# Patient Record
Sex: Female | Born: 1975 | Race: Black or African American | Hispanic: No | Marital: Single | Smoking: Never smoker
Health system: Southern US, Community
[De-identification: ages and names within clinical notes are randomized; demographics above are authoritative.]

## PROBLEM LIST (undated history)

## (undated) DIAGNOSIS — K429 Umbilical hernia without obstruction or gangrene: Secondary | ICD-10-CM

## (undated) DIAGNOSIS — Z9109 Other allergy status, other than to drugs and biological substances: Secondary | ICD-10-CM

---

## 2002-09-20 ENCOUNTER — Emergency Department (HOSPITAL_COMMUNITY): Admission: EM | Admit: 2002-09-20 | Discharge: 2002-09-20 | Payer: Self-pay | Admitting: Emergency Medicine

## 2002-09-20 ENCOUNTER — Encounter: Payer: Self-pay | Admitting: Emergency Medicine

## 2005-04-18 ENCOUNTER — Ambulatory Visit: Payer: Self-pay | Admitting: Obstetrics & Gynecology

## 2009-09-14 ENCOUNTER — Inpatient Hospital Stay (HOSPITAL_COMMUNITY): Admission: AD | Admit: 2009-09-14 | Discharge: 2009-09-14 | Payer: Self-pay | Admitting: Obstetrics & Gynecology

## 2009-11-01 ENCOUNTER — Ambulatory Visit: Payer: Self-pay | Admitting: Oncology

## 2010-01-17 ENCOUNTER — Encounter (INDEPENDENT_AMBULATORY_CARE_PROVIDER_SITE_OTHER): Payer: Self-pay | Admitting: Obstetrics & Gynecology

## 2010-01-17 ENCOUNTER — Ambulatory Visit (HOSPITAL_COMMUNITY): Admission: RE | Admit: 2010-01-17 | Discharge: 2010-01-17 | Payer: Self-pay | Admitting: Obstetrics & Gynecology

## 2010-01-17 ENCOUNTER — Ambulatory Visit: Payer: Self-pay | Admitting: Vascular Surgery

## 2010-04-28 ENCOUNTER — Inpatient Hospital Stay (HOSPITAL_COMMUNITY): Admission: AD | Admit: 2010-04-28 | Discharge: 2010-05-02 | Payer: Self-pay | Admitting: Obstetrics & Gynecology

## 2010-04-29 ENCOUNTER — Encounter (INDEPENDENT_AMBULATORY_CARE_PROVIDER_SITE_OTHER): Payer: Self-pay | Admitting: Obstetrics and Gynecology

## 2010-05-02 ENCOUNTER — Encounter: Admission: RE | Admit: 2010-05-02 | Discharge: 2010-06-01 | Payer: Self-pay | Admitting: Obstetrics & Gynecology

## 2010-06-02 ENCOUNTER — Encounter
Admission: RE | Admit: 2010-06-02 | Discharge: 2010-07-02 | Payer: Self-pay | Source: Home / Self Care | Admitting: Obstetrics & Gynecology

## 2010-07-03 ENCOUNTER — Encounter
Admission: RE | Admit: 2010-07-03 | Discharge: 2010-08-02 | Payer: Self-pay | Source: Home / Self Care | Attending: Obstetrics & Gynecology | Admitting: Obstetrics & Gynecology

## 2010-08-03 ENCOUNTER — Encounter
Admission: RE | Admit: 2010-08-03 | Discharge: 2010-08-14 | Payer: Self-pay | Source: Home / Self Care | Attending: Obstetrics & Gynecology | Admitting: Obstetrics & Gynecology

## 2010-10-11 LAB — CBC
HCT: 31.8 % — ABNORMAL LOW (ref 36.0–46.0)
HCT: 35.5 % — ABNORMAL LOW (ref 36.0–46.0)
Hemoglobin: 10.9 g/dL — ABNORMAL LOW (ref 12.0–15.0)
Hemoglobin: 12.2 g/dL (ref 12.0–15.0)
MCH: 34 pg (ref 26.0–34.0)
MCH: 34.1 pg — ABNORMAL HIGH (ref 26.0–34.0)
MCHC: 34.4 g/dL (ref 30.0–36.0)
MCHC: 34.5 g/dL (ref 30.0–36.0)
MCV: 98.5 fL (ref 78.0–100.0)
MCV: 99 fL (ref 78.0–100.0)
Platelets: 165 10*3/uL (ref 150–400)
Platelets: 185 10*3/uL (ref 150–400)
RBC: 3.22 MIL/uL — ABNORMAL LOW (ref 3.87–5.11)
RBC: 3.59 MIL/uL — ABNORMAL LOW (ref 3.87–5.11)
RDW: 12.6 % (ref 11.5–15.5)
RDW: 13.1 % (ref 11.5–15.5)
WBC: 5.8 10*3/uL (ref 4.0–10.5)
WBC: 6.5 10*3/uL (ref 4.0–10.5)

## 2010-10-11 LAB — RPR: RPR Ser Ql: NONREACTIVE

## 2010-10-11 LAB — DIC (DISSEMINATED INTRAVASCULAR COAGULATION)PANEL
D-Dimer, Quant: 1.3 ug/mL-FEU — ABNORMAL HIGH (ref 0.00–0.48)
Fibrinogen: 529 mg/dL — ABNORMAL HIGH (ref 204–475)
INR: 0.99 (ref 0.00–1.49)
Platelets: 162 10*3/uL (ref 150–400)
Prothrombin Time: 13.3 seconds (ref 11.6–15.2)
Smear Review: NONE SEEN
aPTT: 28 seconds (ref 24–37)

## 2010-10-18 LAB — URINALYSIS, ROUTINE W REFLEX MICROSCOPIC
Ketones, ur: NEGATIVE mg/dL
Nitrite: NEGATIVE
Protein, ur: NEGATIVE mg/dL
Urobilinogen, UA: 0.2 mg/dL (ref 0.0–1.0)
pH: 6 (ref 5.0–8.0)

## 2010-10-18 LAB — HCG, QUANTITATIVE, PREGNANCY: hCG, Beta Chain, Quant, S: 114878 m[IU]/mL — ABNORMAL HIGH (ref <5)

## 2010-10-18 LAB — CBC
MCHC: 33.8 g/dL (ref 30.0–36.0)
MCV: 93.9 fL (ref 78.0–100.0)
Platelets: 205 10*3/uL (ref 150–400)
RDW: 12.1 % (ref 11.5–15.5)
WBC: 3.9 10*3/uL — ABNORMAL LOW (ref 4.0–10.5)

## 2011-06-15 ENCOUNTER — Other Ambulatory Visit: Payer: Self-pay

## 2011-06-15 ENCOUNTER — Emergency Department (HOSPITAL_COMMUNITY)
Admission: EM | Admit: 2011-06-15 | Discharge: 2011-06-16 | Disposition: A | Discharge status: Home / Self Care | Payer: 59 | Attending: Emergency Medicine | Admitting: Emergency Medicine

## 2011-06-15 DIAGNOSIS — R0602 Shortness of breath: Secondary | ICD-10-CM | POA: Insufficient documentation

## 2011-06-15 DIAGNOSIS — M940 Chondrocostal junction syndrome [Tietze]: Secondary | ICD-10-CM | POA: Insufficient documentation

## 2011-06-15 DIAGNOSIS — J40 Bronchitis, not specified as acute or chronic: Secondary | ICD-10-CM | POA: Insufficient documentation

## 2011-06-15 DIAGNOSIS — R079 Chest pain, unspecified: Secondary | ICD-10-CM | POA: Insufficient documentation

## 2011-06-15 DIAGNOSIS — R509 Fever, unspecified: Secondary | ICD-10-CM | POA: Insufficient documentation

## 2011-06-16 ENCOUNTER — Encounter: Payer: Self-pay | Admitting: *Deleted

## 2011-06-16 ENCOUNTER — Emergency Department (HOSPITAL_COMMUNITY): Payer: 59

## 2011-06-16 LAB — BASIC METABOLIC PANEL
BUN: 11 mg/dL (ref 6–23)
CO2: 26 mEq/L (ref 19–32)
Chloride: 104 mEq/L (ref 96–112)
Creatinine, Ser: 0.77 mg/dL (ref 0.50–1.10)
Potassium: 4.2 mEq/L (ref 3.5–5.1)

## 2011-06-16 LAB — CBC
HCT: 37.2 % (ref 36.0–46.0)
Hemoglobin: 12.8 g/dL (ref 12.0–15.0)
MCV: 92.8 fL (ref 78.0–100.0)
RBC: 4.01 MIL/uL (ref 3.87–5.11)
WBC: 4.4 10*3/uL (ref 4.0–10.5)

## 2011-06-16 MED ORDER — HYDROCOD POLST-CHLORPHEN POLST 10-8 MG/5ML PO LQCR
5.0000 mL | Freq: Once | ORAL | Status: AC
  Administered 2011-06-16: 5 mL via ORAL
  Filled 2011-06-16: qty 5

## 2011-06-16 MED ORDER — ALBUTEROL SULFATE HFA 108 (90 BASE) MCG/ACT IN AERS
2.0000 | INHALATION_SPRAY | RESPIRATORY_TRACT | Status: DC | PRN
  Administered 2011-06-16: 2 via RESPIRATORY_TRACT
  Filled 2011-06-16 (×2): qty 6.7

## 2011-06-16 MED ORDER — HYDROCOD POLST-CHLORPHEN POLST 10-8 MG/5ML PO LQCR: 5.0000 mL | Freq: Two times a day (BID) | ORAL | Status: DC | PRN

## 2011-06-16 MED ORDER — IBUPROFEN 400 MG PO TABS: 400.0000 mg | ORAL_TABLET | Freq: Three times a day (TID) | ORAL | Status: AC

## 2011-06-16 MED ORDER — IOHEXOL 300 MG/ML  SOLN
100.0000 mL | Freq: Once | INTRAMUSCULAR | Status: AC | PRN
  Administered 2011-06-16: 100 mL via INTRAVENOUS

## 2011-06-16 NOTE — ED Notes (Signed)
Pt given pillow for comfort, and repositioned in bed.

## 2011-06-16 NOTE — ED Notes (Signed)
Pt c/o cough x 2 weeks, shortness of breath and chest pain w/ cough starting today. Pt states worse w/ cough and bending over.

## 2011-06-16 NOTE — ED Provider Notes (Signed)
Medical screening examination/treatment/procedure(s) were performed by non-physician practitioner and as supervising physician I was immediately available for consultation/collaboration.  Jasmine Awe, MD 06/16/11 [phone]

## 2011-06-16 NOTE — ED Notes (Signed)
Patient is alert and oriented x3.  She was given DC instructions with MD referrals.  V/S stable She is DC to home with husband.  She is not showing any signs of distress at this time

## 2011-06-16 NOTE — ED Provider Notes (Signed)
History     CSN: [id number] Arrival date & time: 06/15/2011 11:23 PM   First MD Initiated Contact with Patient 06/16/11 0031      Chief Complaint  Patient presents with  . Pleurisy  . Shortness of Breath  . Cough    HPI: Patient is a 35 y.o. female presenting with shortness of breath and cough. The history is provided by the patient. No language interpreter was used.  Shortness of Breath  The current episode started yesterday. The onset was sudden. The problem has been unchanged. The problem is moderate. The symptoms are relieved by nothing. The symptoms are aggravated by nothing. Associated symptoms include chest pain, a fever, cough and shortness of breath. Her past medical history is significant for eczema. Her past medical history does not include asthma, bronchiolitis, past wheezing or asthma in the family. She has been behaving normally.  Cough Associated symptoms include chest pain, chills and shortness of breath. Her past medical history does not include asthma.  6 patient reports persistent dry hacking cough for approximately 2 weeks reports today approximately 12 noon had onset of left-sided chest pain it was associated with increased shortness of breath. Pain has been constant with varying intensity since onset. Describes the pain as a sharp pulling sensation and worsens with deep breathing coughing and certain movements. Admits the chest wall is tender to palpation particularly on the left side States that she did have a low-grade fever and chills approximately one week ago. But no fever since. Denies nausea vomiting diarrhea or other associated symptoms. History reviewed. No pertinent past medical history.  History reviewed. No pertinent past surgical history.  History reviewed. No pertinent family history.  History  Substance Use Topics  . Smoking status: Never Smoker   . Smokeless tobacco: Not on file  . Alcohol Use: Yes    OB History    Grav Para Term Preterm  Abortions TAB SAB Ect Mult Living                  Review of Systems  Constitutional: Positive for fever and chills.  HENT: Negative.   Eyes: Negative.   Respiratory: Positive for cough and shortness of breath.   Cardiovascular: Positive for chest pain.  Gastrointestinal: Negative.   Genitourinary: Negative.   Musculoskeletal: Negative.   Skin: Negative.   Neurological: Negative.   Hematological: Negative.   Psychiatric/Behavioral: Negative.     Allergies  Review of patient's allergies indicates no known allergies.  Home Medications  No current outpatient prescriptions on file.  BP 107/88  Pulse 77  Temp(Src) 98.5 F (36.9 C) (Oral)  Resp 26  Wt 120 lb (54.432 kg)  SpO2 100%  LMP 06/09/2011  Physical Exam  Constitutional: She is oriented to person, place, and time. She appears well-developed and well-nourished.  HENT:  Head: Normocephalic and atraumatic.  Eyes: Conjunctivae are normal.  Neck: Normal range of motion.  Cardiovascular: Normal rate and regular rhythm.   Pulmonary/Chest: Effort normal. She exhibits tenderness.    Abdominal: Soft. Bowel sounds are normal.  Musculoskeletal: Normal range of motion.  Neurological: She is alert and oriented to person, place, and time.  Skin: Skin is warm and dry.  Psychiatric: She has a normal mood and affect.    ED Course  Procedures Discussed d-dimer results with patient, as well as remaining negative lab results. Discussed need for CT chest engineer to rule out pulmonary embolus. Patient agreeable with plan.  Discuss results of CT chest angiogram patient and  plan to treat for bronchitis and discharged home with inhaler, medication for pain, and encourage close followup with PCP.    Labs Reviewed  CBC  BASIC METABOLIC PANEL  POCT CARDIAC MARKERS  D-DIMER, QUANTITATIVE  POCT PREGNANCY, URINE   No results found.   No diagnosis found.    MDM  PE, lab results, and CT chest angio without acute findings.  Suggests bronchitis as source of patient's cough, and likely costochondritis due to frequent coughing, as source of chest wall pain.      Leanne Chang, NP 06/16/11 [phone]

## 2012-12-29 ENCOUNTER — Encounter (HOSPITAL_COMMUNITY): Payer: Self-pay | Admitting: Emergency Medicine

## 2012-12-29 ENCOUNTER — Emergency Department (HOSPITAL_COMMUNITY)
Admission: EM | Admit: 2012-12-29 | Discharge: 2012-12-29 | Disposition: A | Discharge status: Home / Self Care | Payer: 59 | Source: Home / Self Care | Attending: Emergency Medicine | Admitting: Emergency Medicine

## 2012-12-29 DIAGNOSIS — M436 Torticollis: Secondary | ICD-10-CM

## 2012-12-29 MED ORDER — CYCLOBENZAPRINE HCL 5 MG PO TABS: 5.0000 mg | ORAL_TABLET | Freq: Three times a day (TID) | ORAL | Status: DC | PRN

## 2012-12-29 MED ORDER — OXYCODONE-ACETAMINOPHEN 5-325 MG PO TABS: ORAL_TABLET | ORAL | Status: DC

## 2012-12-29 MED ORDER — IBUPROFEN 800 MG PO TABS
ORAL_TABLET | ORAL | Status: AC
  Filled 2012-12-29: qty 1

## 2012-12-29 MED ORDER — NAPROXEN 500 MG PO TABS: 500.0000 mg | ORAL_TABLET | Freq: Two times a day (BID) | ORAL | Status: DC

## 2012-12-29 MED ORDER — IBUPROFEN 800 MG PO TABS
800.0000 mg | ORAL_TABLET | Freq: Once | ORAL | Status: AC
  Administered 2012-12-29: 800 mg via ORAL

## 2012-12-29 NOTE — ED Provider Notes (Signed)
Chief Complaint:   Chief Complaint  Patient presents with  . Torticollis    History of Present Illness:   Janet Duarte is a 37 year old female who woke up 3 days ago with pain in the left side of the neck. This was localized on the lateral side of the neck, radiating to the trapezius ridge but not down the arm. He she had pain with movement of the neck especially to the left and also with swallowing. She denies any numbness, tingling, or weakness in lower extremities. She's had no fever, chills, headache, or URI symptoms. She denies any chest pain or shortness of breath. She's had no trauma to the neck and denies any prior problems with her neck. He  Review of Systems:  Other than noted above, the patient denies any of the following symptoms: Constitutional:  No fever, chills, or sweats. ENT:  No nasal congestion, sore throat, or oral ulcerations or lesions. Neck:  No swelling, or adenopathy.  Full ROM without pain. Cardiac:  No chest pain, tightness, or pressure. Respiratory:  No cough, wheezing, or dyspnea. M-S:  No joint pain, muscle pain, or back problems. Neuro:  No muscle weakness, numbness or paresthesias.  PMFSH:  Past medical history, family history, social history, meds, and allergies were reviewed.   Physical Exam:   Vital signs:  Pulse 72  Temp(Src) 98.6 F (37 C) (Oral)  Resp 18  SpO2 100%  LMP 12/20/2012 General:  Alert, oriented and in no distress. Eye:  PERRL, full EOMs. ENT:  Pharynx clear, no oral lesions. Neck:  There is moderate tenderness to palpation on the left side of the neck but no midline tenderness. The neck has limited range of motion. She has about 45 of rotation to the right and about 30 of rotation to the left. She has 20 of extension, 45 of flexion, 20 of lateral bending to each side with pain. Lungs:  No respiratory distress.  Breath sounds clear and equal bilaterally.  No wheezes, rales or rhonchi. Heart:  Regular rhythm.  No gallops, murmers,  or rubs. Ext:  No upper extremity edema, pulses full.  Full ROM of joints with no joint or muscle pain to palpation. Neuro:  Alert and oriented times 3.  No focal muscle weakness.  DTRs symmetric.  Sensation intact to light touch. Skin: Clear, warm and dry.  No rash.  Good capillary refill.  Course in Urgent Care Center:   Given ibuprofen 800 mg by mouth for pain.  Assessment:  The encounter diagnosis was Torticollis.  Plan:   1.  The following meds were prescribed:   Discharge Medication List as of 12/29/2012 12:43 PM    START taking these medications   Details  cyclobenzaprine (FLEXERIL) 5 MG tablet Take 1 tablet (5 mg total) by mouth 3 (three) times daily as needed for muscle spasms., Starting 12/29/2012, Until Discontinued, Normal    naproxen (NAPROSYN) 500 MG tablet Take 1 tablet (500 mg total) by mouth 2 (two) times daily., Starting 12/29/2012, Until Discontinued, Normal    oxyCODONE-acetaminophen (PERCOCET) 5-325 MG per tablet 1 to 2 tablets every 6 hours as needed for pain., Print       2.  The patient was instructed in symptomatic care and handouts were given. She was given some neck exercises to do followed by moist heat. 3.  The patient was told to return if becoming worse in any way, if no better in 7 days, and given some red flag symptoms such as fever or  changing neurological symptoms that would indicate earlier return. 4.  Follow up here if no better in one week or if she becomes worse in any way.    Reuben Likes, MD 12/29/12 [phone]

## 2012-12-29 NOTE — ED Notes (Signed)
Pt  Reports  Neck  Pain    X  sev  Days   She  denys  Any  specefic injury  Other than  She  Felt a  Pop   When raised  Up in  Bed  sev  Days  Ago  Pt  Reports  Pain  When  Turns  Neck       Symptoms  Not  releived  By otc  meds   Ambulated  To  Room

## 2015-08-31 ENCOUNTER — Ambulatory Visit (INDEPENDENT_AMBULATORY_CARE_PROVIDER_SITE_OTHER): Payer: 59 | Admitting: Emergency Medicine

## 2015-08-31 ENCOUNTER — Encounter: Payer: Self-pay | Admitting: Emergency Medicine

## 2015-08-31 VITALS — BP 102/76 | HR 83 | Ht 66.0 in | Wt 147.0 lb

## 2015-08-31 DIAGNOSIS — R053 Chronic cough: Secondary | ICD-10-CM

## 2015-08-31 DIAGNOSIS — R05 Cough: Secondary | ICD-10-CM | POA: Diagnosis not present

## 2015-08-31 MED ORDER — ESOMEPRAZOLE MAGNESIUM 40 MG PO CPDR: 40.0000 mg | DELAYED_RELEASE_CAPSULE | Freq: Every day | ORAL | Status: DC

## 2015-08-31 MED ORDER — BENZONATATE 200 MG PO CAPS: 200.0000 mg | ORAL_CAPSULE | Freq: Three times a day (TID) | ORAL | Status: DC | PRN

## 2015-08-31 NOTE — Assessment & Plan Note (Signed)
Without clear precipitating factors. The only precipitant she can identify is cold air. She is tried bronchodilators without any significant effect in the past. We discussed the typical common causes including rhinitis and gastroesophageal reflux. She doesn't have overt symptoms of either. We will empirically treat GERD to see if she benefits with Nexium for one month. I will defer an allergy regimen beyond her existing Singulair and she doesn't have significant allergy symptoms. We discussed voice rest, avoiding throat clearing, using sugar-free candies. We will also give her Tessalon to help with cough suppression. We will perform full pulmonary function testing to evaluate for possible occult asthma. depending on her progress we may decide to pursue bronchoscopy

## 2015-08-31 NOTE — Progress Notes (Signed)
Subjective:    Patient ID: Janet Duarte, female    DOB: [birth year], 40 y.o.   MRN: [id number]  HPI 40 year old woman, never smoker, with very little past medical history who is referred today for evaluation of cough. Her PCP is Dr Malachi Pro.  She has had a persistent cough for around 10 ten yrs, dry cough, never productive. Seems to be worse with cold air, no other certain precipitants. No dyspnea, even w exertion. She is active and works a lot. No wheeze, no CP, no sore throat. She has been treated with cough syrup, singulair, SABA prn. She wonders if the singulair might have helped. No real PND or rhinitis.  Denies GERD sx    Review of Systems  Constitutional: Negative for fever and unexpected weight change.  HENT: Negative for congestion, dental problem, ear pain, nosebleeds, postnasal drip, rhinorrhea, sinus pressure, sneezing, sore throat and trouble swallowing.   Eyes: Negative for redness and itching.  Respiratory: Positive for cough. Negative for chest tightness, shortness of breath and wheezing.   Cardiovascular: Negative for palpitations and leg swelling.  Gastrointestinal: Negative for nausea and vomiting.  Genitourinary: Negative for dysuria.  Musculoskeletal: Negative for joint swelling.  Skin: Negative for rash.  Neurological: Negative for headaches.  Hematological: Does not bruise/bleed easily.  Psychiatric/Behavioral: Negative for dysphoric mood. The patient is not nervous/anxious.    No past medical history on file.   No family history on file.   Social History   Social History  . Marital Status: Single    Spouse Name: N/A  . Number of Children: N/A  . Years of Education: N/A   Occupational History  . Not on file.   Social History Main Topics  . Smoking status: Never Smoker   . Smokeless tobacco: Not on file  . Alcohol Use: Yes  . Drug Use: No  . Sexual Activity: No   Other Topics Concern  . 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   No Known Allergies   Outpatient Prescriptions Prior to Visit  Medication Sig Dispense Refill  . chlorpheniramine-HYDROcodone (TUSSIONEX PENNKINETIC ER) 10-8 MG/5ML LQCR Take 5 mLs by mouth every 12 (twelve) hours as needed. 30 mL 0  . cyclobenzaprine (FLEXERIL) 5 MG tablet Take 1 tablet (5 mg total) by mouth 3 (three) times daily as needed for muscle spasms. 30 tablet 0  . naproxen (NAPROSYN) 500 MG tablet Take 1 tablet (500 mg total) by mouth 2 (two) times daily. 30 tablet 0  . oxyCODONE-acetaminophen (PERCOCET) 5-325 MG per tablet 1 to 2 tablets every 6 hours as needed for pain. 20 tablet 0   No facility-administered medications prior to visit.         Objective:   Physical Exam Filed Vitals:   08/31/15 1609  BP: 102/76  Pulse: 83  Height:  (1.676 m)  Weight: 147 lb (66.679 kg)  SpO2: 98%   Gen: Pleasant, well-nourished, in no distress,  normal affect  ENT: No lesions,  mouth clear,  oropharynx clear, no postnasal drip  Neck: No JVD, no TMG, no carotid bruits  Lungs: No use of accessory muscles, clear without rales or rhonchi  Cardiovascular: RRR, heart sounds normal, no murmur or gallops, no peripheral edema  Musculoskeletal: No deformities, no cyanosis or clubbing  Neuro: alert, non focal  Skin: Warm, no  lesions or rashes     Assessment & Plan:  Chronic cough Without clear precipitating factors. The only precipitant she can identify is cold air. She is tried bronchodilators without any significant effect in the past. We discussed the typical common causes including rhinitis and gastroesophageal reflux. She doesn't have overt symptoms of either. We will empirically treat GERD to see if she benefits with Nexium for one month. I will defer an allergy regimen beyond her existing Singulair and she doesn't have significant allergy symptoms. We discussed voice  rest, avoiding throat clearing, using sugar-free candies. We will also give her Tessalon to help with cough suppression. We will perform full pulmonary function testing to evaluate for possible occult asthma. depending on her progress we may decide to pursue bronchoscopy

## 2015-08-31 NOTE — Patient Instructions (Addendum)
Please start Nexium 40 mg daily for the next month. Take this medication either 1 hour before or 1 hour after eating. Continue Singulair once a day We will start Tessalon Perles to be used as needed for cough suppression. Try to avoid throat clearing. She feel the need to clear your throat because of throat irritation, please try using sugar-free candies and just swallow.  Please try to set up an extended period of time for voice rest were you do not speak at all - you may want to try doing this over a weekend We will perform full pulmonary function testing to review at your next visit Depending on your progress we may decide to schedule bronchoscopy Follow with Dr Delton Coombes in 1 month or next available

## 2015-10-03 ENCOUNTER — Ambulatory Visit: Payer: 59 | Admitting: Emergency Medicine

## 2015-11-02 ENCOUNTER — Ambulatory Visit (INDEPENDENT_AMBULATORY_CARE_PROVIDER_SITE_OTHER): Payer: 59 | Admitting: Emergency Medicine

## 2015-11-02 ENCOUNTER — Encounter: Payer: Self-pay | Admitting: Emergency Medicine

## 2015-11-02 VITALS — BP 110/76 | HR 60 | Ht 67.5 in | Wt 153.0 lb

## 2015-11-02 DIAGNOSIS — R05 Cough: Secondary | ICD-10-CM

## 2015-11-02 DIAGNOSIS — R053 Chronic cough: Secondary | ICD-10-CM

## 2015-11-02 LAB — PULMONARY FUNCTION TEST
DL/VA % PRED: 79 %
DL/VA: 4.13 ml/min/mmHg/L
DLCO cor % pred: 74 %
DLCO cor: 21.63 ml/min/mmHg
DLCO unc % pred: 72 %
DLCO unc: 21.08 ml/min/mmHg
FEF 25-75 POST: 3.3 L/s
FEF 25-75 PRE: 2.6 L/s
FEF2575-%CHANGE-POST: 27 %
FEF2575-%PRED-PRE: 85 %
FEF2575-%Pred-Post: 109 %
FEV1-%Change-Post: 7 %
FEV1-%PRED-PRE: 106 %
FEV1-%Pred-Post: 114 %
FEV1-PRE: 3.01 L
FEV1-Post: 3.23 L
FEV1FVC-%CHANGE-POST: 6 %
FEV1FVC-%PRED-PRE: 93 %
FEV6-%CHANGE-POST: 0 %
FEV6-%PRED-PRE: 114 %
FEV6-%Pred-Post: 115 %
FEV6-Post: 3.92 L
FEV6-Pre: 3.89 L
FEV6FVC-%Change-Post: 0 %
FEV6FVC-%PRED-POST: 102 %
FEV6FVC-%Pred-Pre: 101 %
FVC-%CHANGE-POST: 0 %
FVC-%PRED-POST: 113 %
FVC-%Pred-Pre: 112 %
FVC-POST: 3.92 L
FVC-PRE: 3.89 L
POST FEV6/FVC RATIO: 100 %
PRE FEV1/FVC RATIO: 77 %
Post FEV1/FVC ratio: 82 %
Pre FEV6/FVC Ratio: 100 %
RV % PRED: 97 %
RV: 1.75 L
TLC % pred: 101 %
TLC: 5.67 L

## 2015-11-02 MED ORDER — FLUTICASONE PROPIONATE 50 MCG/ACT NA SUSP: 2.0000 | Freq: Every day | NASAL | Status: DC

## 2015-11-02 NOTE — Progress Notes (Signed)
PFT done today. 

## 2015-11-02 NOTE — Progress Notes (Signed)
 Subjective:    Patient ID: Janet Duarte, female    DOB: 07/05/1976, 40 y.o.   MRN: [id number]  HPI 40-year-old woman, never smoker, with very little past medical history who is referred today for evaluation of cough. Her PCP is Dr Sarah Ringel.  She has had a persistent cough for around 10 ten yrs, dry cough, never productive. Seems to be worse with cold air, no other certain precipitants. No dyspnea, even w exertion. She is active and works a lot. No wheeze, no CP, no sore throat. She has been treated with cough syrup, singulair, SABA prn. She wonders if the singulair might have helped. No real PND or rhinitis.  Denies GERD sx  ROV 11/02/15 -- follow-up visit for evaluation of chronic cough of approximately 10 years duration. She underwent pulmonary function testing today 11/02/15 that I have personally reviewed. This showed normal spirometry with normal flow volume loops and no bronchodilator responsiveness. Lung volumes were normal and her diffusion capacity was slightly decreased. Her flow volume loops showed an intermittent truncation of the inspiratory loop consistent with possible upper airway disease or coughing. We empirically treated GERD with Nexium, and practiced voice rest. She continues Singulair and Tessalon Perles. She may be a bit better, but she is still coughing. has a bit of hoarse voice today but not chronic.    Review of Systems  Constitutional: Negative for fever and unexpected weight change.  HENT: Negative for congestion, dental problem, ear pain, nosebleeds, postnasal drip, rhinorrhea, sinus pressure, sneezing, sore throat and trouble swallowing.   Eyes: Negative for redness and itching.  Respiratory: Positive for cough. Negative for chest tightness, shortness of breath and wheezing.   Cardiovascular: Negative for palpitations and leg swelling.  Gastrointestinal: Negative for nausea and vomiting.  Genitourinary: Negative for dysuria.  Musculoskeletal: Negative for joint  swelling.  Skin: Negative for rash.  Neurological: Negative for headaches.  Hematological: Does not bruise/bleed easily.  Psychiatric/Behavioral: Negative for dysphoric mood. The patient is not nervous/anxious.    No past medical history on file.   No family history on file.   Social History   Social History  . Marital Status: Single    Spouse Name: N/A  . Number of Children: N/A  . Years of Education: N/A   Occupational History  . Not on file.   Social History Main Topics  . Smoking status: Never Smoker   . Smokeless tobacco: Not on file  . Alcohol Use: Yes  . Drug Use: No  . Sexual Activity: No   Other Topics Concern  . 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   No Known Allergies   Outpatient Prescriptions Prior to Visit  Medication Sig Dispense Refill  . benzonatate (TESSALON) 200 MG capsule Take 1 capsule (200 mg total) by mouth 3 (three) times daily as needed for cough. 30 capsule 1  . esomeprazole (NEXIUM) 40 MG capsule Take 1 capsule (40 mg total) by mouth daily at 12 noon. 30 capsule 1  . montelukast (SINGULAIR) 10 MG tablet Take 10 mg by mouth at bedtime.    . guaiFENesin-codeine (ROBITUSSIN AC) 100-10 MG/5ML syrup Take 5 mLs by mouth 4 (four) times daily as needed for cough.     No facility-administered medications prior to visit.         Objective:   Physical Exam   Filed Vitals:   11/02/15 1421 11/02/15 1424  BP:  110/76  Pulse:  60  Height: 5' 7.5" (1.715 m)   Weight: 153 lb (69.4 kg)   SpO2:  99%   Gen: Pleasant, well-nourished, in no distress,  normal affect  ENT: No lesions,  mouth clear,  oropharynx clear, no postnasal drip  Neck: No JVD, no TMG, no carotid bruits  Lungs: No use of accessory muscles, clear without rales or rhonchi  Cardiovascular: RRR, heart sounds normal, no murmur or gallops, no peripheral  edema  Musculoskeletal: No deformities, no cyanosis or clubbing  Neuro: alert, non focal  Skin: Warm, no lesions or rashes     Assessment & Plan:  Chronic cough Spirometry today was reassuring without any evidence of asthma. She did have some slight truncation of her inspiratory loop but could've been consistent with either coughing or upper airway irritation. She didn't seem to benefit from Nexium and we will stop this now. I will let her be off of it for 10 days to see if her symptoms change. At that time I'd like for her to start loratadine and fluticasone nasal spray. In the meantime we will arrange for bronchoscopy for airway inspection and BAL for cell count and eosinophil count.    

## 2015-11-02 NOTE — Assessment & Plan Note (Addendum)
Spirometry today was reassuring without any evidence of asthma. She did have some slight truncation of her inspiratory loop but could've been consistent with either coughing or upper airway irritation. She didn't seem to benefit from Nexium and we will stop this now. I will let her be off of it for 10 days to see if her symptoms change. At that time I'd like for her to start loratadine and fluticasone nasal spray. In the meantime we will arrange for bronchoscopy for airway inspection and BAL for cell count and eosinophil count.  Please stop Nexium now.  In 10 days (11/12/15) we will start loratadine 10mg  daily and start fluticasone nasal spray 2 sprays each nostril daily.  Continue Singulair We will arrange for a bronchoscopy to examine your airways  > Thursday 11/09/15 at 1:00pm[facility]  Follow with Dr Delton CoombesByrum next available .

## 2015-11-02 NOTE — Patient Instructions (Addendum)
Please stop Nexium now.  In 10 days (11/12/15) we will start loratadine 10mg  daily and start fluticasone nasal spray 2 sprays each nostril daily.  Continue Singulair We will arrange for a bronchoscopy to examine your airways  > Thursday [facility]09[facility]15 [facility]00pm[facility]  Follow with Dr Delton CoombesByrum next available .

## 2015-11-09 ENCOUNTER — Encounter (HOSPITAL_COMMUNITY): Payer: Self-pay | Admitting: Respiratory Therapy

## 2015-11-09 ENCOUNTER — Ambulatory Visit (HOSPITAL_COMMUNITY): Admission: RE | Admit: 2015-11-09 | Payer: 59 | Source: Ambulatory Visit | Admitting: Emergency Medicine

## 2015-11-09 ENCOUNTER — Inpatient Hospital Stay (HOSPITAL_COMMUNITY): Admission: RE | Admit: 2015-11-09 | Payer: 59 | Source: Ambulatory Visit

## 2015-11-09 ENCOUNTER — Ambulatory Visit (HOSPITAL_COMMUNITY)
Admission: RE | Admit: 2015-11-09 | Discharge: 2015-11-09 | Disposition: A | Discharge status: Home / Self Care | Payer: 59 | Source: Ambulatory Visit | Attending: Emergency Medicine | Admitting: Emergency Medicine

## 2015-11-09 ENCOUNTER — Encounter (HOSPITAL_COMMUNITY): Admission: RE | Payer: Self-pay | Source: Ambulatory Visit

## 2015-11-09 ENCOUNTER — Encounter (HOSPITAL_COMMUNITY)
Admission: RE | Disposition: A | Discharge status: Home / Self Care | Payer: Self-pay | Source: Ambulatory Visit | Attending: Emergency Medicine

## 2015-11-09 DIAGNOSIS — R05 Cough: Secondary | ICD-10-CM | POA: Diagnosis present

## 2015-11-09 DIAGNOSIS — J392 Other diseases of pharynx: Secondary | ICD-10-CM | POA: Insufficient documentation

## 2015-11-09 HISTORY — PX: VIDEO BRONCHOSCOPY: SHX5072

## 2015-11-09 LAB — BODY FLUID CELL COUNT WITH DIFFERENTIAL
EOS FL: 2 %
Lymphs, Fluid: 10 %
Monocyte-Macrophage-Serous Fluid: 18 % — ABNORMAL LOW (ref 50–90)
Neutrophil Count, Fluid: 70 % — ABNORMAL HIGH (ref 0–25)
WBC FLUID: 42 uL (ref 0–1000)

## 2015-11-09 SURGERY — VIDEO BRONCHOSCOPY WITHOUT FLUORO
Anesthesia: Moderate Sedation | Laterality: Bilateral

## 2015-11-09 MED ORDER — PHENYLEPHRINE HCL 0.25 % NA SOLN
NASAL | Status: DC | PRN
  Administered 2015-11-09: 2 via NASAL

## 2015-11-09 MED ORDER — MIDAZOLAM HCL 5 MG/ML IJ SOLN
INTRAMUSCULAR | Status: AC
  Filled 2015-11-09: qty 2

## 2015-11-09 MED ORDER — SODIUM CHLORIDE 0.9 % IV SOLN
INTRAVENOUS | Status: DC
  Administered 2015-11-09: 14:00:00 via INTRAVENOUS

## 2015-11-09 MED ORDER — LIDOCAINE HCL 1 % IJ SOLN
INTRAMUSCULAR | Status: DC | PRN
  Administered 2015-11-09: 6 mL

## 2015-11-09 MED ORDER — FENTANYL CITRATE (PF) 100 MCG/2ML IJ SOLN
INTRAMUSCULAR | Status: AC
  Filled 2015-11-09: qty 4

## 2015-11-09 MED ORDER — LIDOCAINE HCL 2 % EX GEL
CUTANEOUS | Status: DC | PRN
  Administered 2015-11-09: 1

## 2015-11-09 MED ORDER — FENTANYL CITRATE (PF) 100 MCG/2ML IJ SOLN
INTRAMUSCULAR | Status: DC | PRN
  Administered 2015-11-09: 50 ug via INTRAVENOUS
  Administered 2015-11-09 (×2): 25 ug via INTRAVENOUS

## 2015-11-09 MED ORDER — MIDAZOLAM HCL 10 MG/2ML IJ SOLN
INTRAMUSCULAR | Status: DC | PRN
  Administered 2015-11-09: 1 mg via INTRAVENOUS
  Administered 2015-11-09: 3 mg via INTRAVENOUS

## 2015-11-09 NOTE — Progress Notes (Signed)
Video Bronchoscopy  Done  Bronchial washing done

## 2015-11-09 NOTE — H&P (View-Only) (Signed)
Subjective:    Patient ID: Janet Duarte, female    DOB: 1976-04-11, 40 y.o.   MRN: [id number]  HPI 40 year old woman, never smoker, with very little past medical history who is referred today for evaluation of cough. Her PCP is Dr Malachi Pro.  She has had a persistent cough for around 10 ten yrs, dry cough, never productive. Seems to be worse with cold air, no other certain precipitants. No dyspnea, even w exertion. She is active and works a lot. No wheeze, no CP, no sore throat. She has been treated with cough syrup, singulair, SABA prn. She wonders if the singulair might have helped. No real PND or rhinitis.  Denies GERD sx  ROV 11/02/15 -- follow-up visit for evaluation of chronic cough of approximately 10 years duration. She underwent pulmonary function testing today 11/02/15 that I have personally reviewed. This showed normal spirometry with normal flow volume loops and no bronchodilator responsiveness. Lung volumes were normal and her diffusion capacity was slightly decreased. Her flow volume loops showed an intermittent truncation of the inspiratory loop consistent with possible upper airway disease or coughing. We empirically treated GERD with Nexium, and practiced voice rest. She continues Singulair and Tessalon Perles. She may be a bit better, but she is still coughing. has a bit of hoarse voice today but not chronic.    Review of Systems  Constitutional: Negative for fever and unexpected weight change.  HENT: Negative for congestion, dental problem, ear pain, nosebleeds, postnasal drip, rhinorrhea, sinus pressure, sneezing, sore throat and trouble swallowing.   Eyes: Negative for redness and itching.  Respiratory: Positive for cough. Negative for chest tightness, shortness of breath and wheezing.   Cardiovascular: Negative for palpitations and leg swelling.  Gastrointestinal: Negative for nausea and vomiting.  Genitourinary: Negative for dysuria.  Musculoskeletal: Negative for joint  swelling.  Skin: Negative for rash.  Neurological: Negative for headaches.  Hematological: Does not bruise/bleed easily.  Psychiatric/Behavioral: Negative for dysphoric mood. The patient is not nervous/anxious.    No past medical history on file.   No family history on file.   Social History   Social History  . Marital Status: Single    Spouse Name: N/A  . Number of Children: N/A  . Years of Education: N/A   Occupational History  . Not on file.   Social History Main Topics  . Smoking status: Never Smoker   . Smokeless tobacco: Not on file  . Alcohol Use: Yes  . Drug Use: No  . Sexual Activity: No   Other Topics Concern  . 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   No Known Allergies   Outpatient Prescriptions Prior to Visit  Medication Sig Dispense Refill  . benzonatate (TESSALON) 200 MG capsule Take 1 capsule (200 mg total) by mouth 3 (three) times daily as needed for cough. 30 capsule 1  . esomeprazole (NEXIUM) 40 MG capsule Take 1 capsule (40 mg total) by mouth daily at 12 noon. 30 capsule 1  . montelukast (SINGULAIR) 10 MG tablet Take 10 mg by mouth at bedtime.    Marland Kitchen guaiFENesin-codeine (ROBITUSSIN AC) 100-10 MG/5ML syrup Take 5 mLs by mouth 4 (four) times daily as needed for cough.     No facility-administered medications prior to visit.         Objective:   Physical Exam  Filed Vitals:   11/02/15 1421 11/02/15 1424  BP:  110/76  Pulse:  60  Height: 5' 7.5" (1.715 m)   Weight: 153 lb (69.4 kg)   SpO2:  99%   Gen: Pleasant, well-nourished, in no distress,  normal affect  ENT: No lesions,  mouth clear,  oropharynx clear, no postnasal drip  Neck: No JVD, no TMG, no carotid bruits  Lungs: No use of accessory muscles, clear without rales or rhonchi  Cardiovascular: RRR, heart sounds normal, no murmur or gallops, no peripheral  edema  Musculoskeletal: No deformities, no cyanosis or clubbing  Neuro: alert, non focal  Skin: Warm, no lesions or rashes     Assessment & Plan:  Chronic cough Spirometry today was reassuring without any evidence of asthma. She did have some slight truncation of her inspiratory loop but could've been consistent with either coughing or upper airway irritation. She didn't seem to benefit from Nexium and we will stop this now. I will let her be off of it for 10 days to see if her symptoms change. At that time I'd like for her to start loratadine and fluticasone nasal spray. In the meantime we will arrange for bronchoscopy for airway inspection and BAL for cell count and eosinophil count.

## 2015-11-09 NOTE — Op Note (Signed)
[facility] Patient Name: Janet MakiLavone Duarte Pocedure Date: 11/09/2015 MRN: [id number] Attending MD: Leslye Peerobert S Momoko Slezak MD, MD Date of Birth: [birth year] CSN: Finalized Age: [age] Admit Type: Outpatient Gender: Female Procedure:            Bronchoscopy Indications:          Chronic cough with normal chest X-ray Providers:            Leslye Peerobert S. Brodie Correll MD, MD, Lorinda CreedSherry Brewer RRT,RCP, Ruthell Rummagearol                        Blackstock RRT,RCP Referring MD:          Medicines:            Lidocaine applied to nares and subglottic space,                        Fentanyl 100 mcg IV, Midazolam 4 mg IV Complications:        No immediate complications Estimated Blood Loss: Estimated blood loss: none. Procedure:            Pre-Anesthesia Assessment:                       - A History and Physical has been performed. Patient                        meds and allergies have been reviewed. The risks and                        benefits of the procedure and the sedation options and                        risks were discussed with the patient. All questions                        were answered and informed consent was obtained.                        Patient identification and proposed procedure were                        verified prior to the procedure by the physician in the                        procedure room. Mental Status Examination: normal.                        Airway Examination: normal oropharyngeal airway.                        Respiratory Examination: clear to auscultation. CV                        Examination: normal. ASA Grade Assessment: I - A normal                        healthy patient. After reviewing the risks and                        benefits, the patient was deemed in satisfactory  condition to undergo the procedure. The anesthesia plan                        was to use moderate sedation / analgesia (conscious                        sedation).  Immediately prior to administration of                        medications, the patient was re-assessed for adequacy                        to receive sedatives. The heart rate, respiratory rate,                        oxygen saturations, blood pressure, adequacy of                        pulmonary ventilation, and response to care were                        monitored throughout the procedure. The physical status                        of the patient was re-assessed after the procedure.                       After obtaining informed consent, the bronchoscope was                        passed under direct vision. Throughout the procedure,                        the patient's blood pressure, pulse, and oxygen                        saturations were monitored continuously. the [id number]                        [id number] scope was introduced through the right nostril                        and advanced to the tracheobronchial tree of both                        lungs. The patient tolerated the procedure well. The                        procedure was accomplished without difficulty. Scope In: 2:39:34 PM Scope Out: 2:51:27 PM Findings:      The posterior pharynx was severely cobblestoned and edematous. The       glottis was narrowed, consistent with chronic inflammation. the cords       were partially obscured by edematous tissue. The cords were normal,       moved normally with phonation and respiration. The trachea was intubated       and inspected. The trachea is of normal caliber. The carina is sharp.       The tracheobronchial tree was examined to at least the first       subsegmental  level. Bronchial mucosa and anatomy are normal; there are       no endobronchial lesions, and no secretions.      Bronchoalveolar lavage was performed in the RUL apical segment (B1) of       the lung and sent for cell count, bacterial culture, viral smears &       culture, and fungal & AFB analysis and cytology.  60 mL of fluid were       instilled. 10 mL were returned. The return was clear. There were no       mucoid plugs in the return fluid. Impression:           - Chronic cough with normal chest X-ray                       - The examination was normal.                       - Bronchoalveolar lavage was performed.                       - Arytenoid and posterior pharyngeal edema probably                        secondary to gastroesophageal reflux disease (GERD) was                        found.                       - Severe cobblestoning of the pharyngeal roof,                        impacting the superior aspect of the glottis Moderate Sedation:      Moderate (conscious) sedation was personally administered by the       endoscopist. The following parameters were monitored: oxygen saturation,       heart rate, blood pressure, respiratory rate, EKG, adequacy of pulmonary       ventilation, and response to care. Total physician intraservice time was       16 minutes. Recommendation:       - Await BAL results.                       - Follow up with bronchoscopist as previously scheduled. Procedure Code(s):    --- Professional ---                       [id number], Bronchoscopy, rigid or flexible, including                        fluoroscopic guidance, when performed; with bronchial                        alveolar lavage Diagnosis Code(s):    --- Professional ---                       R05, Cough                       K21.9, Gastro-esophageal reflux disease without  esophagitis CPT copyright 2016 American Medical Association. All rights reserved. The codes documented in this report are preliminary and upon coder review may  be revised to meet current compliance requirements. Leslye Peer, MD Leslye Peer MD, MD 11/09/2015 3:06:59 PM Number of Addenda: 0

## 2015-11-09 NOTE — Interval H&P Note (Signed)
PCCM INterval Note  Pt reports no new issues, presents today for further evaluation of her cough.  Procedure explained, all questions answered.   Plan to proceed with FOB + BAL for cell counts, eosinophils, culture data  Levy Pupaobert Emon Miggins, MD, PhD 11/09/2015, 2:29 PM [facility] [phone] or if no answer [phone]

## 2015-11-09 NOTE — Discharge Instructions (Signed)
Flexible Bronchoscopy, Care After These instructions give you information on caring for yourself after your procedure. Your doctor may also give you more specific instructions. Call your doctor if you have any problems or questions after your procedure. HOME CARE  Do not eat or drink anything for 2 hours after your procedure. If you try to eat or drink before the medicine wears off, food or drink could go into your lungs. You could also burn yourself.  After 2 hours have passed and when you can cough and gag normally, you may eat soft food and drink liquids slowly.  The day after the test, you may eat your normal diet.  You may do your normal activities.  Keep all doctor visits. GET HELP RIGHT AWAY IF:  You get more and more short of breath.  You get light-headed.  You feel like you are going to pass out (faint).  You have chest pain.  You have new problems that worry you.  You cough up more than a little blood.  You cough up more blood than before. MAKE SURE YOU:  Understand these instructions.  Will watch your condition.  Will get help right away if you are not doing well or get worse.   This information is not intended to replace advice given to you by your health care provider. Make sure you discuss any questions you have with your health care provider.   Document Released: 05/12/2009 Document Revised: 07/20/2013 Document Reviewed: 03/19/2013 Elsevier Interactive Patient Education 2016 [employer]  Nothing to eat or drink until   4:45 pm  toda  11/09/2015  Please call Dr Delton CoombesByrum for any questions or concerns. [phone].

## 2015-11-10 LAB — ACID FAST SMEAR (AFB)

## 2015-11-10 LAB — ACID FAST SMEAR (AFB, MYCOBACTERIA): Acid Fast Smear: NEGATIVE

## 2015-11-12 LAB — CULTURE, BAL-QUANTITATIVE W GRAM STAIN

## 2015-11-12 LAB — CULTURE, BAL-QUANTITATIVE: CULTURE: NORMAL

## 2015-11-13 ENCOUNTER — Encounter (HOSPITAL_COMMUNITY): Payer: Self-pay | Admitting: Emergency Medicine

## 2015-12-01 ENCOUNTER — Encounter: Payer: Self-pay | Admitting: Emergency Medicine

## 2015-12-01 ENCOUNTER — Ambulatory Visit (INDEPENDENT_AMBULATORY_CARE_PROVIDER_SITE_OTHER): Payer: 59 | Admitting: Emergency Medicine

## 2015-12-01 VITALS — BP 106/76 | HR 69 | Ht 66.0 in | Wt 150.0 lb

## 2015-12-01 DIAGNOSIS — R05 Cough: Secondary | ICD-10-CM

## 2015-12-01 DIAGNOSIS — R053 Chronic cough: Secondary | ICD-10-CM

## 2015-12-01 NOTE — Patient Instructions (Addendum)
We will refer you to see Dr Gary FleetWhalen to undergo allergy evaluation and probable skin testing.  Please continue the same medications you are taking  Follow with Dr Delton CoombesByrum in 3 months or sooner if you have any problems.

## 2015-12-01 NOTE — Progress Notes (Signed)
Subjective:    Patient ID: Janet Duarte, female    DOB: Feb 11, 1976, 40 y.o.   MRN: [id number]  Cough Pertinent negatives include no ear pain, eye redness, fever, headaches, postnasal drip, rash, rhinorrhea, sore throat, shortness of breath or wheezing.   40 year old woman, never smoker, with very little past medical history who is referred today for evaluation of cough. Her PCP is Dr Malachi Pro.  She has had a persistent cough for around 10 ten yrs, dry cough, never productive. Seems to be worse with cold air, no other certain precipitants. No dyspnea, even w exertion. She is active and works a lot. No wheeze, no CP, no sore throat. She has been treated with cough syrup, singulair, SABA prn. She wonders if the singulair might have helped. No real PND or rhinitis.  Denies GERD sx  ROV 11/02/15 -- follow-up visit for evaluation of chronic cough of approximately 10 years duration. She underwent pulmonary function testing today 11/02/15 that I have personally reviewed. This showed normal spirometry with normal flow volume loops and no bronchodilator responsiveness. Lung volumes were normal and her diffusion capacity was slightly decreased. Her flow volume loops showed an intermittent truncation of the inspiratory loop consistent with possible upper airway disease or coughing. We empirically treated GERD with Nexium, and practiced voice rest. She continues Singulair and Tessalon Perles. She may be a bit better, but she is still coughing. has a bit of hoarse voice today but not chronic.   ROV 12/01/15 -- follow-up visit for patient with chronic cough and suspected upper airway irritation syndrome.she underwent bronchoscopy on 11/09/15 that was significant for severe arytenoid and posterior pharyngeal erythema consistent with her chronic upper airway irritation. Her distal airways were all normal. The cords moved normally. There were no anatomical lesions.  Her eosinophil count was normal, cultures were normal.  She remains on loratadine, singulair, fluticasone nasal spray. She is still coughing, may be a bit better with the warmer weather. We stopped her PPI and she has not had any GERD sx.    Review of Systems  Constitutional: Negative for fever and unexpected weight change.  HENT: Negative for congestion, dental problem, ear pain, nosebleeds, postnasal drip, rhinorrhea, sinus pressure, sneezing, sore throat and trouble swallowing.   Eyes: Negative for redness and itching.  Respiratory: Positive for cough. Negative for chest tightness, shortness of breath and wheezing.   Cardiovascular: Negative for palpitations and leg swelling.  Gastrointestinal: Negative for nausea and vomiting.  Genitourinary: Negative for dysuria.  Musculoskeletal: Negative for joint swelling.  Skin: Negative for rash.  Neurological: Negative for headaches.  Hematological: Does not bruise/bleed easily.  Psychiatric/Behavioral: Negative for dysphoric mood. The patient is not nervous/anxious.    No past medical history on file.   No family history on file.   Social History   Social History  . Marital Status: Single    Spouse Name: N/A  . Number of Children: N/A  . Years of Education: N/A   Occupational History  . Not on file.   Social History Main Topics  . Smoking status: Never Smoker   . Smokeless tobacco: Not on file  . Alcohol Use: Yes  . Drug Use: No  . Sexual Activity: No   Other Topics Concern  . 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   No Known Allergies  Outpatient Prescriptions Prior to Visit  Medication Sig Dispense Refill  . benzonatate (TESSALON) 200 MG capsule Take 1 capsule (200 mg total) by mouth 3 (three) times daily as needed for cough. 30 capsule 1  . fluticasone (FLONASE) 50 MCG/ACT nasal spray Place 2 sprays into both nostrils daily. 16 g 5  . montelukast  (SINGULAIR) 10 MG tablet Take 10 mg by mouth at bedtime.    Marland Kitchen. esomeprazole (NEXIUM) 40 MG capsule Take 1 capsule (40 mg total) by mouth daily at 12 noon. 30 capsule 1   No facility-administered medications prior to visit.         Objective:   Physical Exam Filed Vitals:   12/01/15 1519  BP: 106/76  Pulse: 69  Height: 5\' 6"  (1.676 m)  Weight: 150 lb (68.04 kg)  SpO2: 98%   Gen: Pleasant, well-nourished, in no distress,  normal affect  ENT: No lesions,  mouth clear,  oropharynx clear, no postnasal drip  Neck: No JVD, no TMG, no carotid bruits  Lungs: No use of accessory muscles, clear without rales or rhonchi  Cardiovascular: RRR, heart sounds normal, no murmur or gallops, no peripheral edema  Musculoskeletal: No deformities, no cyanosis or clubbing  Neuro: alert, non focal  Skin: Warm, no lesions or rashes     Assessment & Plan:  Chronic cough No evidence of eosinophilic process based on her BAL. I do believe that allergic rhinitis is driving her symptoms. She did not respond to acid suppression. She is using loratadine, Singulair, fluticasone nasal spray. I will refer her for allergy skin testing as she may benefit from immunotherapy.

## 2015-12-01 NOTE — Assessment & Plan Note (Signed)
No evidence of eosinophilic process based on her BAL. I do believe that allergic rhinitis is driving her symptoms. She did not respond to acid suppression. She is using loratadine, Singulair, fluticasone nasal spray. I will refer her for allergy skin testing as she may benefit from immunotherapy.

## 2015-12-21 ENCOUNTER — Other Ambulatory Visit: Payer: Self-pay | Admitting: Allergy and Immunology

## 2015-12-21 ENCOUNTER — Ambulatory Visit
Admission: RE | Admit: 2015-12-21 | Discharge: 2015-12-21 | Disposition: A | Discharge status: Home / Self Care | Payer: 59 | Source: Ambulatory Visit | Attending: Allergy and Immunology | Admitting: Allergy and Immunology

## 2015-12-21 DIAGNOSIS — R05 Cough: Secondary | ICD-10-CM

## 2015-12-21 DIAGNOSIS — R059 Cough, unspecified: Secondary | ICD-10-CM

## 2016-03-05 ENCOUNTER — Ambulatory Visit: Payer: 59 | Admitting: Emergency Medicine

## 2016-12-21 IMAGING — CR DG CHEST 2V
2 series · 2 of 2 positions shown · non-contrast
Comparison: No priors.

CLINICAL DATA: 39-year-old female with history of chronic cough for
the past 10 years. Allergies.

EXAM:
CHEST  2 VIEW

[w chest pa]
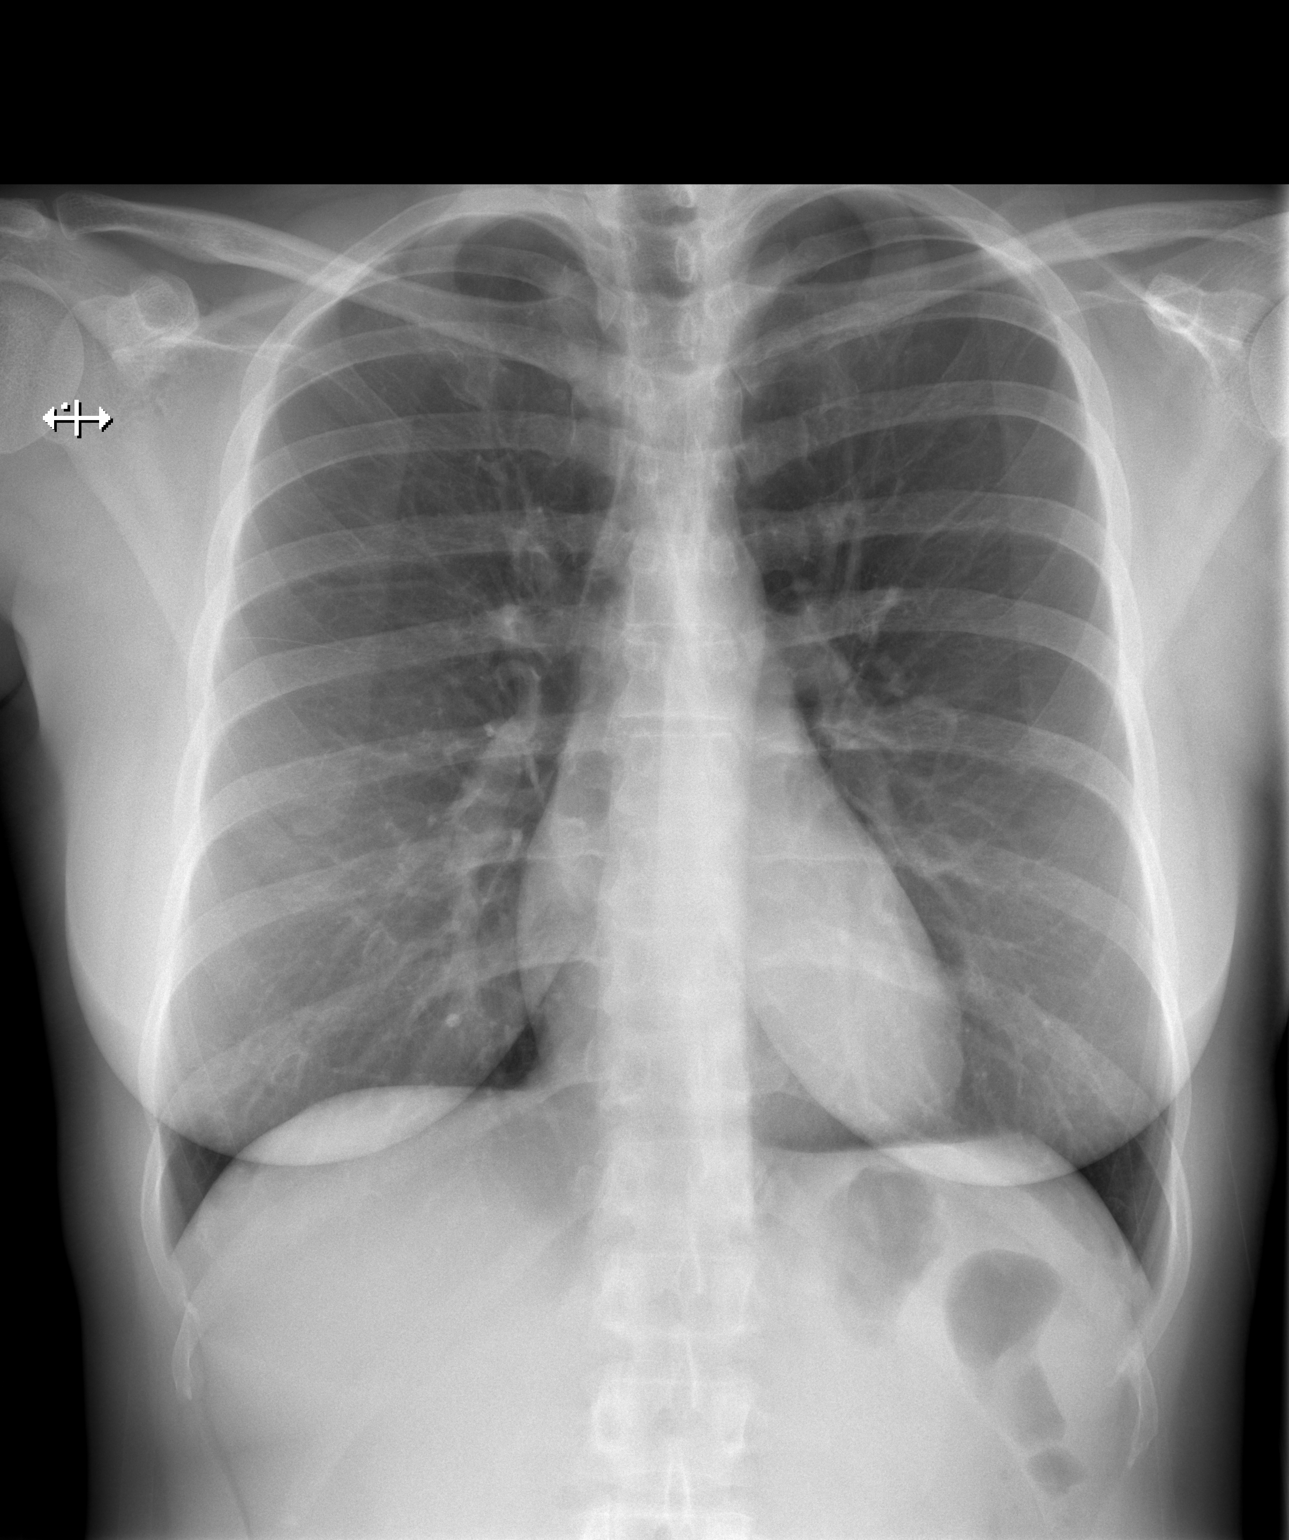

[w chest lat]
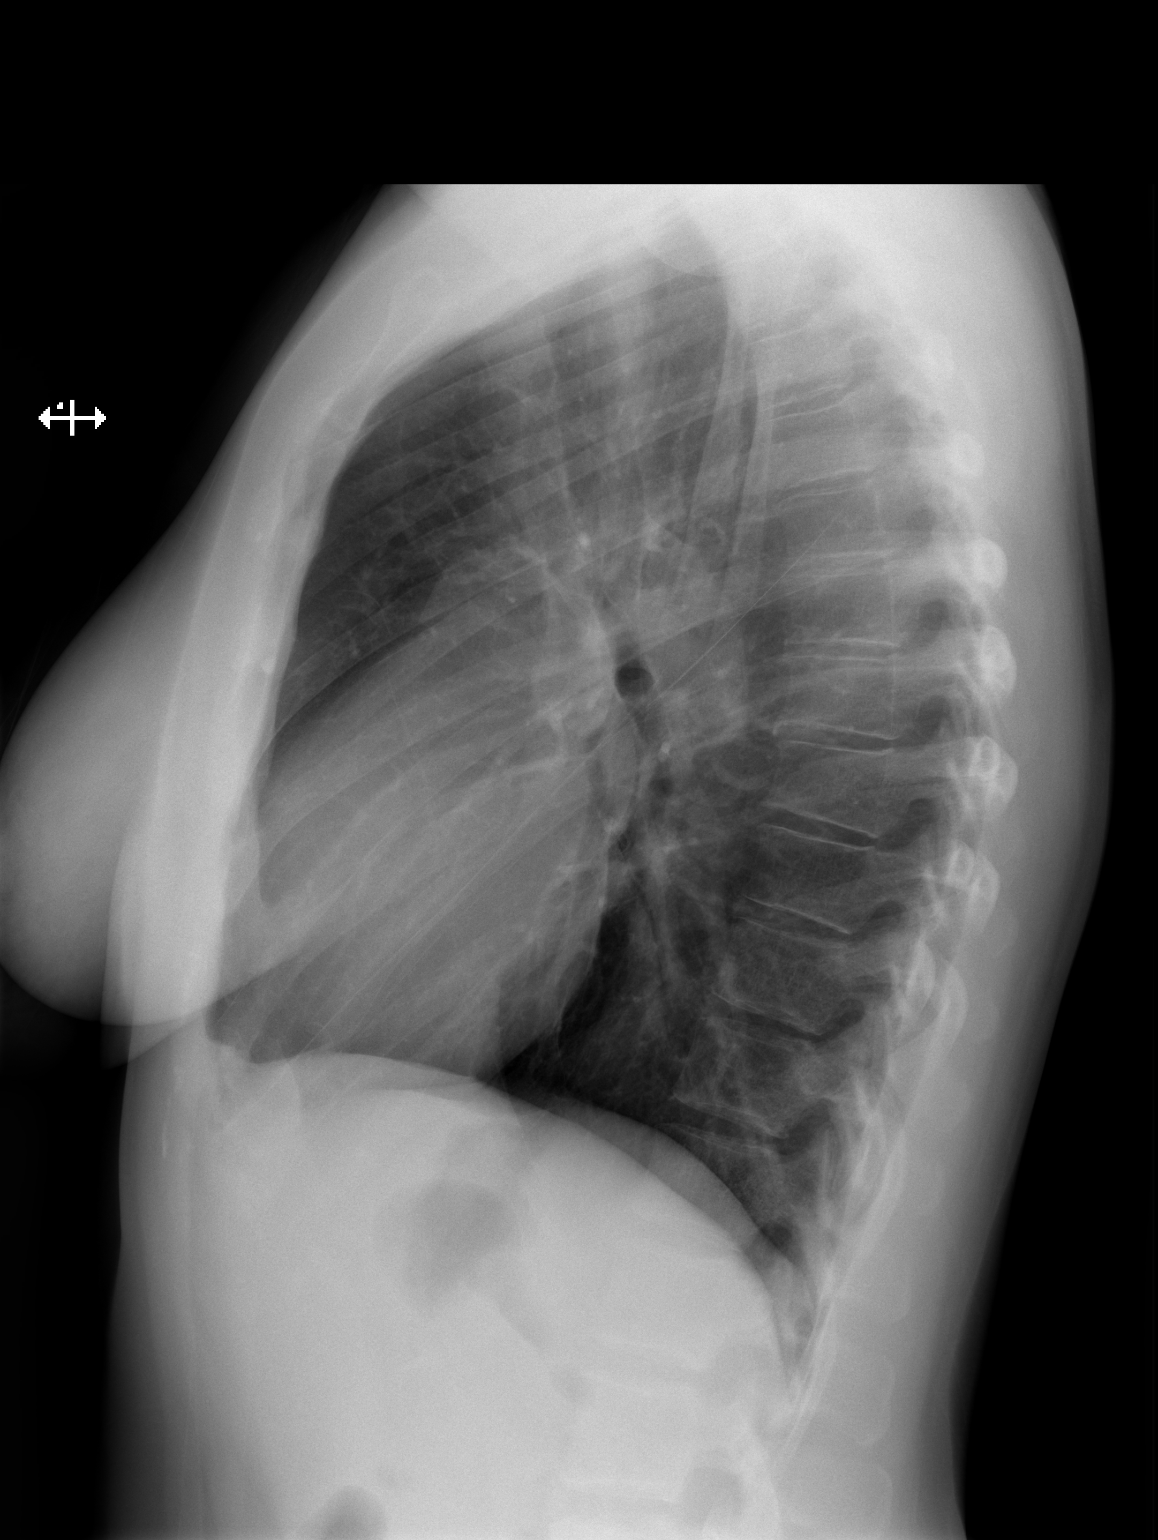

[2 of 2 positions shown; findings below may reference images not displayed]

FINDINGS: Lung volumes are normal. No consolidative airspace disease. No
pleural effusions. No pneumothorax. No pulmonary nodule or mass
noted. Pulmonary vasculature and the cardiomediastinal silhouette
are within normal limits.
IMPRESSION: No radiographic evidence of acute cardiopulmonary disease.

## 2017-06-02 ENCOUNTER — Ambulatory Visit: Payer: Self-pay | Admitting: Surgery

## 2017-06-02 NOTE — H&P (Signed)
History of Present Illness Janet Duarte(Fayelynn Distel K. Naiomi Musto MD; 06/02/2017 11:16 AM) The patient is a 41 year old female who presents with an umbilical hernia. Referred by Dr. Malachi ProSarah Ringel for umbilical hernia This is a 41 year old female in good health who presents with a three-week history of a palpable mass at her umbilicus. On 05/10/17 she was out of town and felt a painful mass at her umbilicus. She was seen in urgent care and they diagnosed with an umbilical hernia. This has hurt intermittently since that time. She denies any obstruction. No change in bowel habits. No imaging of this area. She denies any surgery in this area.   Past Surgical History Judithann Sauger(Patricia King, [place]; 06/02/2017 9:48 AM) Cesarean Section - 1   Diagnostic Studies History Judithann Sauger(Patricia King, [place]; 06/02/2017 9:48 AM) Colonoscopy  never Mammogram  never Pap Smear  1-5 years ago  Allergies Judithann Sauger(Patricia King, RMA; 06/02/2017 9:50 AM) No Known Drug Allergies 06/02/2017 Allergies Reconciled   Medication History Judithann Sauger(Patricia King, RMA; 06/02/2017 9:50 AM) Levocetirizine Dihydrochloride (5MG  Tablet, Oral) Active. Montelukast Sodium (10MG  Tablet, Oral) Active. Ibuprofen (600MG  Tablet, Oral) Active. Medications Reconciled  Social History Judithann Sauger(Patricia King, [place]; 06/02/2017 9:49 AM) Alcohol use  Occasional alcohol use. Caffeine use  Carbonated beverages. No drug use  Tobacco use  Never smoker.  Family History Judithann Sauger(Patricia King, [place]; 06/02/2017 9:48 AM) Arthritis  Mother. Cancer  Mother. Cerebrovascular Accident  Mother. Diabetes Mellitus  Mother. Hypertension  Mother.  Pregnancy / Birth History Judithann Sauger(Patricia King, [place]; 06/02/2017 9:49 AM) Age at menarche  13 years. Gravida  1 Maternal age  43-35 Para  1 Regular periods     Review of Systems Judithann Sauger(Patricia King RMA; 06/02/2017 9:49 AM) General Not Present- Appetite Loss, Chills, Fatigue, Fever, Night Sweats, Weight Gain and Weight Loss. Skin Not Present- Change in Wart/Mole,  Dryness, Hives, Jaundice, New Lesions, Non-Healing Wounds, Rash and Ulcer. HEENT Present- Seasonal Allergies. Not Present- Earache, Hearing Loss, Hoarseness, Nose Bleed, Oral Ulcers, Ringing in the Ears, Sinus Pain, Sore Throat, Visual Disturbances, Wears glasses/contact lenses and Yellow Eyes. Respiratory Not Present- Bloody sputum, Chronic Cough, Difficulty Breathing, Snoring and Wheezing. Breast Not Present- Breast Mass, Breast Pain, Nipple Discharge and Skin Changes. Cardiovascular Not Present- Chest Pain, Difficulty Breathing Lying Down, Leg Cramps, Palpitations, Rapid Heart Rate, Shortness of Breath and Swelling of Extremities. Gastrointestinal Present- Abdominal Pain, Excessive gas and Gets full quickly at meals. Not Present- Bloating, Bloody Stool, Change in Bowel Habits, Chronic diarrhea, Constipation, Difficulty Swallowing, Hemorrhoids, Indigestion, Nausea, Rectal Pain and Vomiting. Female Genitourinary Not Present- Frequency, Nocturia, Painful Urination, Pelvic Pain and Urgency. Musculoskeletal Not Present- Back Pain, Joint Pain, Joint Stiffness, Muscle Pain, Muscle Weakness and Swelling of Extremities. Neurological Not Present- Decreased Memory, Fainting, Headaches, Numbness, Seizures, Tingling, Tremor, Trouble walking and Weakness. Psychiatric Not Present- Anxiety, Bipolar, Change in Sleep Pattern, Depression, Fearful and Frequent crying. Endocrine Not Present- Cold Intolerance, Excessive Hunger, Hair Changes, Heat Intolerance, Hot flashes and New Diabetes. Hematology Not Present- Blood Thinners, Easy Bruising, Excessive bleeding, Gland problems, HIV and Persistent Infections.  Vitals Judithann Sauger(Patricia King RMA; 06/02/2017 9:50 AM) 06/02/2017 9:49 AM Weight: 156.6 lb Height: 70in Body Surface Area: 1.88 m Body Mass Index: 22.47 kg/m  Temp.: 99.31F  Pulse: 86 (Regular)  BP: 125/72 (Sitting, Left Arm, Standard)       Physical Exam Molli Hazard(Marialuisa Basara K. Greene Diodato MD; 06/02/2017 11:17 AM) The  physical exam findings are as follows: Note:WDWN in NAD Eyes: Pupils equal, round; sclera anicteric HENT: Oral mucosa moist; good dentition Neck: No masses  palpated, no thyromegaly Lungs: CTA bilaterally; normal respiratory effort CV: Regular rate and rhythm; no murmurs; extremities well-perfused with no edema Abd: +bowel sounds, soft, non-tender, no palpable organomegaly; protruding umbilical hernia that is reducible when she is supine. This enlarges with Valsalva maneuver. The defect seems to be only about 1.5 cm across. Skin: Warm, dry; no sign of jaundice Psychiatric - alert and oriented x 4; calm mood and affect    Assessment & Plan Molli Hazard K. Kelwin Gibler MD; 06/02/2017 10:17 AM) UMBILICAL HERNIA WITHOUT OBSTRUCTION OR GANGRENE (K42.9) Current Plans Schedule for Surgery - Umbilical hernia repair with mesh. The surgical procedure has been discussed with the patient. Potential risks, benefits, alternative treatments, and expected outcomes have been explained. All of the patient's questions at this time have been answered. The likelihood of reaching the patient's treatment goal is good. The patient understand the proposed surgical procedure and wishes to proceed.    Janet Duarte. Corliss Skains, MD, [facility]  General/ Trauma Surgery  06/02/2017 11:18 AM

## 2017-08-12 ENCOUNTER — Encounter (HOSPITAL_BASED_OUTPATIENT_CLINIC_OR_DEPARTMENT_OTHER): Payer: Self-pay | Admitting: *Deleted

## 2017-08-12 ENCOUNTER — Other Ambulatory Visit: Payer: Self-pay

## 2017-08-15 NOTE — Progress Notes (Signed)
Pt given Ensure and instructed to drink by 0700 day of surgery.

## 2017-08-19 ENCOUNTER — Ambulatory Visit (HOSPITAL_BASED_OUTPATIENT_CLINIC_OR_DEPARTMENT_OTHER): Payer: 59 | Admitting: Anesthesiology

## 2017-08-19 ENCOUNTER — Other Ambulatory Visit: Payer: Self-pay

## 2017-08-19 ENCOUNTER — Encounter (HOSPITAL_BASED_OUTPATIENT_CLINIC_OR_DEPARTMENT_OTHER)
Admission: RE | Disposition: A | Discharge status: Home / Self Care | Payer: Self-pay | Source: Ambulatory Visit | Attending: Surgery

## 2017-08-19 ENCOUNTER — Encounter (HOSPITAL_BASED_OUTPATIENT_CLINIC_OR_DEPARTMENT_OTHER): Payer: Self-pay | Admitting: Anesthesiology

## 2017-08-19 ENCOUNTER — Ambulatory Visit (HOSPITAL_BASED_OUTPATIENT_CLINIC_OR_DEPARTMENT_OTHER)
Admission: RE | Admit: 2017-08-19 | Discharge: 2017-08-19 | Disposition: A | Discharge status: Home / Self Care | Payer: 59 | Source: Ambulatory Visit | Attending: Surgery | Admitting: Surgery

## 2017-08-19 DIAGNOSIS — K429 Umbilical hernia without obstruction or gangrene: Secondary | ICD-10-CM | POA: Insufficient documentation

## 2017-08-19 HISTORY — DX: Other allergy status, other than to drugs and biological substances: Z91.09

## 2017-08-19 HISTORY — PX: UMBILICAL HERNIA REPAIR: SHX196

## 2017-08-19 HISTORY — DX: Umbilical hernia without obstruction or gangrene: K42.9

## 2017-08-19 SURGERY — REPAIR, HERNIA, UMBILICAL, ADULT
Anesthesia: General | Site: Abdomen

## 2017-08-19 MED ORDER — CEFAZOLIN SODIUM-DEXTROSE 2-4 GM/100ML-% IV SOLN
INTRAVENOUS | Status: AC
  Filled 2017-08-19: qty 100

## 2017-08-19 MED ORDER — FENTANYL CITRATE (PF) 100 MCG/2ML IJ SOLN
INTRAMUSCULAR | Status: AC
  Filled 2017-08-19: qty 2

## 2017-08-19 MED ORDER — FENTANYL CITRATE (PF) 100 MCG/2ML IJ SOLN
25.0000 ug | INTRAMUSCULAR | Status: DC | PRN
  Administered 2017-08-19 (×2): 50 ug via INTRAVENOUS

## 2017-08-19 MED ORDER — LIDOCAINE 2% (20 MG/ML) 5 ML SYRINGE
INTRAMUSCULAR | Status: AC
  Filled 2017-08-19: qty 5

## 2017-08-19 MED ORDER — KETOROLAC TROMETHAMINE 30 MG/ML IJ SOLN
INTRAMUSCULAR | Status: AC
  Filled 2017-08-19: qty 1

## 2017-08-19 MED ORDER — ONDANSETRON HCL 4 MG/2ML IJ SOLN
INTRAMUSCULAR | Status: AC
  Filled 2017-08-19: qty 2

## 2017-08-19 MED ORDER — DEXAMETHASONE SODIUM PHOSPHATE 10 MG/ML IJ SOLN
INTRAMUSCULAR | Status: AC
  Filled 2017-08-19: qty 1

## 2017-08-19 MED ORDER — DEXAMETHASONE SODIUM PHOSPHATE 10 MG/ML IJ SOLN
INTRAMUSCULAR | Status: DC | PRN
  Administered 2017-08-19: 10 mg via INTRAVENOUS

## 2017-08-19 MED ORDER — KETOROLAC TROMETHAMINE 30 MG/ML IJ SOLN
30.0000 mg | Freq: Once | INTRAMUSCULAR | Status: AC
  Administered 2017-08-19: 30 mg via INTRAVENOUS

## 2017-08-19 MED ORDER — ACETAMINOPHEN 500 MG PO TABS
ORAL_TABLET | ORAL | Status: AC
  Filled 2017-08-19: qty 2

## 2017-08-19 MED ORDER — MIDAZOLAM HCL 2 MG/2ML IJ SOLN
INTRAMUSCULAR | Status: AC
  Filled 2017-08-19: qty 2

## 2017-08-19 MED ORDER — CHLORHEXIDINE GLUCONATE CLOTH 2 % EX PADS: 6.0000 | MEDICATED_PAD | Freq: Once | CUTANEOUS | Status: DC

## 2017-08-19 MED ORDER — LACTATED RINGERS IV SOLN
INTRAVENOUS | Status: DC
  Administered 2017-08-19: 11:00:00 via INTRAVENOUS
  Administered 2017-08-19: 10 mL/h via INTRAVENOUS

## 2017-08-19 MED ORDER — PROPOFOL 500 MG/50ML IV EMUL
INTRAVENOUS | Status: AC
  Filled 2017-08-19: qty 50

## 2017-08-19 MED ORDER — CELECOXIB 200 MG PO CAPS
ORAL_CAPSULE | ORAL | Status: AC
  Filled 2017-08-19: qty 1

## 2017-08-19 MED ORDER — CELECOXIB 200 MG PO CAPS
200.0000 mg | ORAL_CAPSULE | ORAL | Status: AC
  Administered 2017-08-19: 200 mg via ORAL

## 2017-08-19 MED ORDER — PROPOFOL 10 MG/ML IV BOLUS
INTRAVENOUS | Status: DC | PRN
Start: 2017-08-19 — End: 2017-08-19
  Administered 2017-08-19: 150 mg via INTRAVENOUS

## 2017-08-19 MED ORDER — METOCLOPRAMIDE HCL 5 MG/ML IJ SOLN: 10.0000 mg | Freq: Once | INTRAMUSCULAR | Status: DC | PRN

## 2017-08-19 MED ORDER — FENTANYL CITRATE (PF) 100 MCG/2ML IJ SOLN
50.0000 ug | INTRAMUSCULAR | Status: DC | PRN
  Administered 2017-08-19: 100 ug via INTRAVENOUS

## 2017-08-19 MED ORDER — ACETAMINOPHEN 500 MG PO TABS
1000.0000 mg | ORAL_TABLET | ORAL | Status: AC
  Administered 2017-08-19: 1000 mg via ORAL

## 2017-08-19 MED ORDER — LIDOCAINE 2% (20 MG/ML) 5 ML SYRINGE
INTRAMUSCULAR | Status: DC | PRN
  Administered 2017-08-19: 80 mg via INTRAVENOUS

## 2017-08-19 MED ORDER — MEPERIDINE HCL 25 MG/ML IJ SOLN: 6.2500 mg | INTRAMUSCULAR | Status: DC | PRN

## 2017-08-19 MED ORDER — BUPIVACAINE-EPINEPHRINE (PF) 0.5% -1:200000 IJ SOLN
INTRAMUSCULAR | Status: DC | PRN
Start: 2017-08-19 — End: 2017-08-19
  Administered 2017-08-19: 10 mL via PERINEURAL

## 2017-08-19 MED ORDER — PROPOFOL 10 MG/ML IV BOLUS
INTRAVENOUS | Status: AC
  Filled 2017-08-19: qty 20

## 2017-08-19 MED ORDER — GABAPENTIN 300 MG PO CAPS
ORAL_CAPSULE | ORAL | Status: AC
  Filled 2017-08-19: qty 1

## 2017-08-19 MED ORDER — SCOPOLAMINE 1 MG/3DAYS TD PT72
1.0000 | MEDICATED_PATCH | Freq: Once | TRANSDERMAL | Status: DC | PRN
  Administered 2017-08-19: 1.5 mg via TRANSDERMAL

## 2017-08-19 MED ORDER — GABAPENTIN 300 MG PO CAPS
300.0000 mg | ORAL_CAPSULE | ORAL | Status: AC
  Administered 2017-08-19: 300 mg via ORAL

## 2017-08-19 MED ORDER — HYDROCODONE-ACETAMINOPHEN 5-325 MG PO TABS: 1.0000 | ORAL_TABLET | Freq: Four times a day (QID) | ORAL | 0 refills | Status: DC | PRN

## 2017-08-19 MED ORDER — CEFAZOLIN SODIUM-DEXTROSE 2-4 GM/100ML-% IV SOLN
2.0000 g | INTRAVENOUS | Status: AC
  Administered 2017-08-19: 2 g via INTRAVENOUS

## 2017-08-19 MED ORDER — ONDANSETRON HCL 4 MG/2ML IJ SOLN
INTRAMUSCULAR | Status: DC | PRN
  Administered 2017-08-19: 4 mg via INTRAVENOUS

## 2017-08-19 MED ORDER — SCOPOLAMINE 1 MG/3DAYS TD PT72
MEDICATED_PATCH | TRANSDERMAL | Status: AC
Start: 2017-08-19 — End: 2017-08-19
  Filled 2017-08-19: qty 1

## 2017-08-19 MED ORDER — HYDROCODONE-ACETAMINOPHEN 7.5-325 MG PO TABS: 1.0000 | ORAL_TABLET | Freq: Once | ORAL | Status: DC | PRN

## 2017-08-19 MED ORDER — MIDAZOLAM HCL 2 MG/2ML IJ SOLN
1.0000 mg | INTRAMUSCULAR | Status: DC | PRN
  Administered 2017-08-19: 2 mg via INTRAVENOUS

## 2017-08-19 SURGICAL SUPPLY — 60 items
APPLICATOR COTTON TIP 6IN STRL (MISCELLANEOUS) IMPLANT
BENZOIN TINCTURE PRP APPL 2/3 (GAUZE/BANDAGES/DRESSINGS) ×4 IMPLANT
BLADE CLIPPER SURG (BLADE) ×4 IMPLANT
BLADE HEX COATED 2.75 (ELECTRODE) ×4 IMPLANT
BLADE SURG 15 STRL LF DISP TIS (BLADE) ×2 IMPLANT
BLADE SURG 15 STRL SS (BLADE) ×2
CANISTER SUCT 1200ML W/VALVE (MISCELLANEOUS) IMPLANT
CHLORAPREP W/TINT 26ML (MISCELLANEOUS) ×4 IMPLANT
CLOSURE WOUND 1/2 X4 (GAUZE/BANDAGES/DRESSINGS) ×1
COVER BACK TABLE 60X90IN (DRAPES) ×4 IMPLANT
COVER MAYO STAND STRL (DRAPES) ×4 IMPLANT
DECANTER SPIKE VIAL GLASS SM (MISCELLANEOUS) IMPLANT
DRAPE LAPAROTOMY T 102X78X121 (DRAPES) ×4 IMPLANT
DRAPE UTILITY XL STRL (DRAPES) ×4 IMPLANT
DRSG TEGADERM 4X4.75 (GAUZE/BANDAGES/DRESSINGS) ×4 IMPLANT
ELECT REM PT RETURN 9FT ADLT (ELECTROSURGICAL) ×4
ELECTRODE REM PT RTRN 9FT ADLT (ELECTROSURGICAL) ×2 IMPLANT
GAUZE SPONGE 4X4 12PLY STRL LF (GAUZE/BANDAGES/DRESSINGS) IMPLANT
GLOVE BIO SURGEON STRL SZ7 (GLOVE) ×4 IMPLANT
GLOVE BIOGEL PI IND STRL 7.0 (GLOVE) ×2 IMPLANT
GLOVE BIOGEL PI IND STRL 7.5 (GLOVE) ×2 IMPLANT
GLOVE BIOGEL PI INDICATOR 7.0 (GLOVE) ×2
GLOVE BIOGEL PI INDICATOR 7.5 (GLOVE) ×2
GLOVE ECLIPSE 6.5 STRL STRAW (GLOVE) ×8 IMPLANT
GOWN STRL REUS W/ TWL LRG LVL3 (GOWN DISPOSABLE) ×4 IMPLANT
GOWN STRL REUS W/TWL LRG LVL3 (GOWN DISPOSABLE) ×4
NDL SAFETY ECLIPSE 18X1.5 (NEEDLE) IMPLANT
NEEDLE HYPO 18GX1.5 SHARP (NEEDLE)
NEEDLE HYPO 25X1 1.5 SAFETY (NEEDLE) ×4 IMPLANT
NS IRRIG 1000ML POUR BTL (IV SOLUTION) IMPLANT
PACK BASIN DAY SURGERY FS (CUSTOM PROCEDURE TRAY) ×4 IMPLANT
PENCIL BUTTON HOLSTER BLD 10FT (ELECTRODE) ×4 IMPLANT
SLEEVE SCD COMPRESS KNEE MED (MISCELLANEOUS) ×4 IMPLANT
SPONGE GAUZE 2X2 8PLY STER LF (GAUZE/BANDAGES/DRESSINGS) ×1
SPONGE GAUZE 2X2 8PLY STRL LF (GAUZE/BANDAGES/DRESSINGS) ×3 IMPLANT
SPONGE LAP 4X18 X RAY DECT (DISPOSABLE) ×4 IMPLANT
STAPLER VISISTAT 35W (STAPLE) IMPLANT
STRIP CLOSURE SKIN 1/2X4 (GAUZE/BANDAGES/DRESSINGS) ×3 IMPLANT
SUT MNCRL AB 4-0 PS2 18 (SUTURE) ×4 IMPLANT
SUT NOVA 0 T19/GS 22DT (SUTURE) ×4 IMPLANT
SUT NOVA NAB DX-16 0-1 5-0 T12 (SUTURE) IMPLANT
SUT NOVA NAB GS-21 1 T12 (SUTURE) IMPLANT
SUT PDS AB 0 CT 36 (SUTURE) IMPLANT
SUT PROLENE 0 CT 1 CR/8 (SUTURE) IMPLANT
SUT SILK 3 0 TIES 17X18 (SUTURE)
SUT SILK 3-0 18XBRD TIE BLK (SUTURE) IMPLANT
SUT VIC AB 2-0 CT1 27 (SUTURE)
SUT VIC AB 2-0 CT1 TAPERPNT 27 (SUTURE) IMPLANT
SUT VIC AB 3-0 SH 27 (SUTURE) ×2
SUT VIC AB 3-0 SH 27X BRD (SUTURE) ×2 IMPLANT
SUT VIC AB 4-0 BRD 54 (SUTURE) IMPLANT
SUT VIC AB 4-0 SH 18 (SUTURE) IMPLANT
SUT VICRYL 4-0 PS2 18IN ABS (SUTURE) IMPLANT
SYR BULB 3OZ (MISCELLANEOUS) IMPLANT
SYR CONTROL 10ML LL (SYRINGE) ×4 IMPLANT
TOWEL OR 17X24 6PK STRL BLUE (TOWEL DISPOSABLE) ×4 IMPLANT
TOWEL OR NON WOVEN STRL DISP B (DISPOSABLE) IMPLANT
TUBE CONNECTING 20'X1/4 (TUBING)
TUBE CONNECTING 20X1/4 (TUBING) IMPLANT
YANKAUER SUCT BULB TIP NO VENT (SUCTIONS) IMPLANT

## 2017-08-19 NOTE — H&P (Signed)
History of Present Illness The patient is a 42 year old female who presents with an umbilical hernia. Referred by Dr. Malachi ProSarah Ringel for umbilical hernia This is a 42 year old female in good health who presents with a three-week history of a palpable mass at her umbilicus. On 05/10/17 she was out of town and felt a painful mass at her umbilicus. She was seen in urgent care and they diagnosed with an umbilical hernia. This has hurt intermittently since that time. She denies any obstruction. No change in bowel habits. No imaging of this area. She denies any surgery in this area.   Past Surgical History  Cesarean Section - 1   Diagnostic Studies History  Colonoscopy  never Mammogram  never Pap Smear  1-5 years ago  Allergies  No Known Drug Allergies  Allergies Reconciled   Medication History  Levocetirizine Dihydrochloride (5MG  Tablet, Oral) Active. Montelukast Sodium (10MG  Tablet, Oral) Active. Ibuprofen (600MG  Tablet, Oral) Active. Medications Reconciled  Social History  Alcohol use  Occasional alcohol use. Caffeine use  Carbonated beverages. No drug use  Tobacco use  Never smoker.  Family History  Arthritis  Mother. Cancer  Mother. Cerebrovascular Accident  Mother. Diabetes Mellitus  Mother. Hypertension  Mother.  Pregnancy / Birth History  Age at menarche  13 years. Gravida  1 Maternal age  44-35 Para  1 Regular periods     Review of Systems General Not Present- Appetite Loss, Chills, Fatigue, Fever, Night Sweats, Weight Gain and Weight Loss. Skin Not Present- Change in Wart/Mole, Dryness, Hives, Jaundice, New Lesions, Non-Healing Wounds, Rash and Ulcer. HEENT Present- Seasonal Allergies. Not Present- Earache, Hearing Loss, Hoarseness, Nose Bleed, Oral Ulcers, Ringing in the Ears, Sinus Pain, Sore Throat, Visual Disturbances, Wears glasses/contact lenses and Yellow Eyes. Respiratory Not Present- Bloody sputum, Chronic Cough,  Difficulty Breathing, Snoring and Wheezing. Breast Not Present- Breast Mass, Breast Pain, Nipple Discharge and Skin Changes. Cardiovascular Not Present- Chest Pain, Difficulty Breathing Lying Down, Leg Cramps, Palpitations, Rapid Heart Rate, Shortness of Breath and Swelling of Extremities. Gastrointestinal Present- Abdominal Pain, Excessive gas and Gets full quickly at meals. Not Present- Bloating, Bloody Stool, Change in Bowel Habits, Chronic diarrhea, Constipation, Difficulty Swallowing, Hemorrhoids, Indigestion, Nausea, Rectal Pain and Vomiting. Female Genitourinary Not Present- Frequency, Nocturia, Painful Urination, Pelvic Pain and Urgency. Musculoskeletal Not Present- Back Pain, Joint Pain, Joint Stiffness, Muscle Pain, Muscle Weakness and Swelling of Extremities. Neurological Not Present- Decreased Memory, Fainting, Headaches, Numbness, Seizures, Tingling, Tremor, Trouble walking and Weakness. Psychiatric Not Present- Anxiety, Bipolar, Change in Sleep Pattern, Depression, Fearful and Frequent crying. Endocrine Not Present- Cold Intolerance, Excessive Hunger, Hair Changes, Heat Intolerance, Hot flashes and New Diabetes. Hematology Not Present- Blood Thinners, Easy Bruising, Excessive bleeding, Gland problems, HIV and Persistent Infections.  Vitals Weight: 156.6 lb Height: 70in Body Surface Area: 1.88 m Body Mass Index: 22.47 kg/m  Temp.: 99.10F  Pulse: 86 (Regular)  BP: 125/72 (Sitting, Left Arm, Standard)       Physical Exam The physical exam findings are as follows: Note:WDWN in NAD Eyes: Pupils equal, round; sclera anicteric HENT: Oral mucosa moist; good dentition Neck: No masses palpated, no thyromegaly Lungs: CTA bilaterally; normal respiratory effort CV: Regular rate and rhythm; no murmurs; extremities well-perfused with no edema Abd: +bowel sounds, soft, non-tender, no palpable organomegaly; protruding umbilical hernia that is reducible when she is  supine. This enlarges with Valsalva maneuver. The defect seems to be only about 1.5 cm across. Skin: Warm, dry; no sign of jaundice Psychiatric - alert  and oriented x 4; calm mood and affect    Assessment & Plan  UMBILICAL HERNIA WITHOUT OBSTRUCTION OR GANGRENE (K42.9) Current Plans Schedule for Surgery - Umbilical hernia repair with mesh. The surgical procedure has been discussed with the patient. Potential risks, benefits, alternative treatments, and expected outcomes have been explained. All of the patient's questions at this time have been answered. The likelihood of reaching the patient's treatment goal is good. The patient understand the proposed surgical procedure and wishes to proceed.     Wilmon Arms. Corliss Skains, MD, [facility] Surgery  General/ Trauma Surgery  08/19/2017 10:33 AM

## 2017-08-19 NOTE — Anesthesia Procedure Notes (Signed)
Procedure Name: LMA Insertion Date/Time: 08/19/2017 10:55 AM Performed by: Gar GibbonKeeton, Zakkery Dorian S, CRNA Pre-anesthesia Checklist: Patient identified, Emergency Drugs available, Suction available and Patient being monitored Patient Re-evaluated:Patient Re-evaluated prior to induction Oxygen Delivery Method: Circle system utilized Preoxygenation: Pre-oxygenation with 100% oxygen Induction Type: IV induction Ventilation: Mask ventilation without difficulty LMA: LMA inserted LMA Size: 4.0 Number of attempts: 1 Airway Equipment and Method: Bite block Placement Confirmation: positive ETCO2 Tube secured with: Tape Dental Injury: Teeth and Oropharynx as per pre-operative assessment

## 2017-08-19 NOTE — Op Note (Signed)
Indications:  The patient presented with a history of an uncomfortable umbilical hernia.  The patient was examined and we recommended umbilical hernia repair..  Pre-operative diagnosis:  Umbilical hernia  Post-operative diagnosis:  Same  Procedure:  Umbilical hernia repair   Procedure Details  The patient was seen again in the Holding Room. The risks, benefits, complications, treatment options, and expected outcomes were discussed with the patient. The possibilities of reaction to medication, pulmonary aspiration, perforation of viscus, bleeding, recurrent infection, the need for additional procedures, and development of a complication requiring transfusion or further operation were discussed with the patient and/or family. There was concurrence with the proposed plan, and informed consent was obtained. The site of surgery was properly noted/marked. The patient was taken to the Operating Room, identified as Janet Duarte, and the procedure verified as umbilical hernia repair. A Time Out was held and the above information confirmed.  After an adequate level of general anesthesia was obtained, the patient's abdomen was prepped with Chloraprep and draped in sterile fashion.  We made a transverse incision below the umbilicus.  Dissection was carried down to the hernia sac with cautery.  We dissected bluntly around the hernia sac down to the edge of the fascial defect.  We reduced the hernia sac back into the pre-peritoneal space.  The fascial defect measured 8 mm.  We cleared the fascia in all directions.  I made the decision not to use mesh because of the small size of the defect. The fascial defect was closed with multiple interrupted figure-of-eight 0 Novofil sutures.  The base of the umbilicus was tacked down with 3-0 Vicryl.  3-0 Vicryl was used to close the subcutaneous tissues and 4-0 Monocryl was used to close the skin.  Steri-strips and clean dressing were applied.  The patient was extubated and  brought to the recovery room in stable condition.  All sponge, instrument, and needle counts were correct prior to closure and at the conclusion of the case.   Estimated Blood Loss: Minimal          Complications: None; patient tolerated the procedure well.         Disposition: PACU - hemodynamically stable.         Condition: stable  Wilmon ArmsMatthew K. Corliss Skainssuei, MD, [facility]  General/ Trauma Surgery  08/19/2017 11:38 AM

## 2017-08-19 NOTE — Anesthesia Postprocedure Evaluation (Signed)
Anesthesia Post Note  Patient: Janet Duarte  Procedure(s) Performed: HERNIA REPAIR UMBILICAL ERAS PATHWAY (N/A Abdomen)     Patient location during evaluation: PACU Anesthesia Type: General Level of consciousness: awake and alert and oriented Pain management: pain level controlled Vital Signs Assessment: post-procedure vital signs reviewed and stable Respiratory status: spontaneous breathing, nonlabored ventilation and respiratory function stable Cardiovascular status: blood pressure returned to baseline and stable Postop Assessment: no apparent nausea or vomiting Anesthetic complications: no    Last Vitals:  Vitals:   08/19/17 1245 08/19/17 1315  BP:  (!) 147/85  Pulse: (!) 56 60  Resp: 17 16  Temp:  37.1 C  SpO2: 100% 100%    Last Pain:  Vitals:   08/19/17 1315  TempSrc:   PainSc: 6                  Semaje Kinker A.

## 2017-08-19 NOTE — Transfer of Care (Signed)
Immediate Anesthesia Transfer of Care Note  Patient: Janet Duarte  Procedure(s) Performed: HERNIA REPAIR UMBILICAL ERAS PATHWAY (N/A Abdomen)  Patient Location: PACU  Anesthesia Type:General  Level of Consciousness: awake, sedated and patient cooperative  Airway & Oxygen Therapy: Patient Spontanous Breathing and Patient connected to face mask oxygen  Post-op Assessment: Report given to RN and Post -op Vital signs reviewed and stable  Post vital signs: Reviewed and stable  Last Vitals:  Vitals:   08/19/17 0954  BP: 123/77  Pulse: 72  Resp: 18  Temp: 36.8 C  SpO2: 100%    Last Pain:  Vitals:   08/19/17 0954  TempSrc: Oral      Patients Stated Pain Goal: 1 (08/19/17 0954)  Complications: No apparent anesthesia complications

## 2017-08-19 NOTE — Anesthesia Preprocedure Evaluation (Signed)
Anesthesia Evaluation  Patient identified by MRN, date of birth, ID band Patient awake    Reviewed: Allergy & Precautions, NPO status , Patient's Chart, lab work & pertinent test results  Airway Mallampati: II  TM Distance: >3 FB Neck ROM: Full    Dental  (+) Teeth Intact   Pulmonary neg pulmonary ROS,    Pulmonary exam normal breath sounds clear to auscultation       Cardiovascular negative cardio ROS Normal cardiovascular exam Rhythm:Regular Rate:Normal     Neuro/Psych negative neurological ROS  negative psych ROS   GI/Hepatic negative GI ROS, Neg liver ROS,   Endo/Other  negative endocrine ROS  Renal/GU negative Renal ROS  negative genitourinary   Musculoskeletal Umbilical hernia   Abdominal   Peds  Hematology negative hematology ROS (+)   Anesthesia Other Findings   Reproductive/Obstetrics                             Anesthesia Physical Anesthesia Plan  ASA: I  Anesthesia Plan: General   Post-op Pain Management:    Induction: Intravenous  PONV Risk Score and Plan: 4 or greater and Ondansetron, Scopolamine patch - Pre-op, Midazolam, Dexamethasone and Treatment may vary due to age or medical condition  Airway Management Planned: Oral ETT  Additional Equipment:   Intra-op Plan:   Post-operative Plan: Extubation in OR  Informed Consent: I have reviewed the patients History and Physical, chart, labs and discussed the procedure including the risks, benefits and alternatives for the proposed anesthesia with the patient or authorized representative who has indicated his/her understanding and acceptance.   Dental advisory given  Plan Discussed with: CRNA, Anesthesiologist and Surgeon  Anesthesia Plan Comments:         Anesthesia Quick Evaluation

## 2017-08-19 NOTE — Discharge Instructions (Signed)
[facility] ° °UMBILICAL OR INGUINAL HERNIA REPAIR: POST OP INSTRUCTIONS ° °Always review your discharge instruction sheet given to you by the facility where your surgery was performed. °IF YOU HAVE DISABILITY OR FAMILY LEAVE FORMS, YOU MUST BRING THEM TO THE OFFICE FOR PROCESSING.   °DO NOT GIVE THEM TO YOUR DOCTOR. ° °1. A  prescription for pain medication may be given to you upon discharge.  Take your pain medication as prescribed, if needed.  If narcotic pain medicine is not needed, then you may take acetaminophen (Tylenol) or ibuprofen (Advil) as needed. °2. Take your usually prescribed medications unless otherwise directed. °3. If you need a refill on your pain medication, please contact your pharmacy.  They will contact our office to request authorization. Prescriptions will not be filled after 5 pm or on week-ends. °4. You should follow a light diet the first 24 hours after arrival home, such as soup and crackers, etc.  Be sure to include lots of fluids daily.  Resume your normal diet the day after surgery. °5. Most patients will experience some swelling and bruising around the umbilicus or in the groin and scrotum.  Ice packs and reclining will help.  Swelling and bruising can take several days to resolve.  °6. It is common to experience some constipation if taking pain medication after surgery.  Increasing fluid intake and taking a stool softener (such as Colace) will usually help or prevent this problem from occurring.  A mild laxative (Milk of Magnesia or Miralax) should be taken according to package directions if there are no bowel movements after 48 hours. °7. Unless discharge instructions indicate otherwise, you may remove your bandages 24-48 hours after surgery, and you may shower at that time.  You will have steri-strips (small skin tapes) in place directly over the incision.  These strips should be left on the skin for 7-10 days. °8. ACTIVITIES:  You may resume regular (light)  daily activities beginning the next day--such as daily self-care, walking, climbing stairs--gradually increasing activities as tolerated.  You may have sexual intercourse when it is comfortable.  Refrain from any heavy lifting or straining until approved by your doctor. °a. You may drive when you are no longer taking prescription pain medication, you can comfortably wear a seatbelt, and you can safely maneuver your car and apply brakes. °b. RETURN TO WORK:  2-3 weeks with light duty - no lifting over 15 lbs. °9. You should see your doctor in the office for a follow-up appointment approximately 2-3 weeks after your surgery.  Make sure that you call for this appointment within a day or two after you arrive home to insure a convenient appointment time. °10. OTHER INSTRUCTIONS:  __________________________________________________________________________________________________________________________________________________________________________________________  °WHEN TO CALL YOUR DOCTOR: °1. Fever over 101.0 °2. Inability to urinate °3. Nausea and/or vomiting °4. Extreme swelling or bruising °5. Continued bleeding from incision. °6. Increased pain, redness, or drainage from the incision ° °The clinic staff is available to answer your questions during regular business hours.  Please don’t hesitate to call and ask to speak to one of the nurses for clinical concerns.  If you have a medical emergency, go to the nearest emergency room or call 911.  A surgeon from [facility] is always on call at the hospital ° ° °[address], [address], [place], [place]  [address] ? ° [address] [address], [place], [place]   [address] °[phone]    [phone]    FAX [phone] °Web   site: [email or web address]   Post Anesthesia Home Care Instructions  Activity: Get plenty of rest for the remainder of the day. A responsible individual must stay with you for 24 hours following the procedure.  For the next  24 hours, DO NOT: -Drive a car -Advertising copywriterperate machinery -Drink alcoholic beverages -Take any medication unless instructed by your physician -Make any legal decisions or sign important papers.  Meals: Start with liquid foods such as gelatin or soup. Progress to regular foods as tolerated. Avoid greasy, spicy, heavy foods. If nausea and/or vomiting occur, drink only clear liquids until the nausea and/or vomiting subsides. Call your physician if vomiting continues.  Special Instructions/Symptoms: Your throat may feel dry or sore from the anesthesia or the breathing tube placed in your throat during surgery. If this causes discomfort, gargle with warm salt water. The discomfort should disappear within 24 hours.  If you had a scopolamine patch placed behind your ear for the management of post- operative nausea and/or vomiting:  1. The medication in the patch is effective for 72 hours, after which it should be removed.  Wrap patch in a tissue and discard in the trash. Wash hands thoroughly with soap and water. 2. You may remove the patch earlier than 72 hours if you experience unpleasant side effects which may include dry mouth, dizziness or visual disturbances. 3. Avoid touching the patch. Wash your hands with soap and water after contact with the patch.     Toradol given at 12:30 pm.  30 mg, no Ibuprofen until 6:35 pm

## 2017-08-21 ENCOUNTER — Encounter (HOSPITAL_BASED_OUTPATIENT_CLINIC_OR_DEPARTMENT_OTHER): Payer: Self-pay | Admitting: Surgery

## 2018-06-22 ENCOUNTER — Ambulatory Visit: Payer: Self-pay | Admitting: Surgery

## 2018-06-22 NOTE — H&P (Signed)
History of Present Illness Janet Duarte. Melida Northington MD; 06/22/2018 2:22 PM) The patient is a 42 year old female who presents with an umbilical hernia. PCP - Malachi Pro, MD  This is a 42 year old female in good health who presented one year ago with a palpable mass at her umbilicus. On 05/10/17 she was out of town and felt a painful mass at her umbilicus. She was seen in urgent care and they diagnosed with an umbilical hernia. She underwent umbilical hernia repair on 08/19/17. She had a very small defect below her umbilicus that was repaired primarily with Novofil suture. The patient was last seen in February of this year and was doing well. However over the last several months she has developed more bulging and swelling above the umbilicus. Occasional the swelling becomes fairly large and uncomfortable. He remains reducible. She presents now for evaluation to see if she has another hernia.  She denies any issues with digestion or bowel movements. The patient works at [employer] and the holiday season is a very very busy time.   Problem List/Past Medical Molli Hazard K. Curley Fayette, MD; 06/22/2018 2:25 PM) UMBILICAL HERNIA WITHOUT OBSTRUCTION OR GANGRENE (K42.9) VENTRAL HERNIA WITHOUT OBSTRUCTION OR GANGRENE (K43.9)  Past Surgical History (Eulice Rutledge K. Corliss Skains, MD; 06/22/2018 2:25 PM) Cesarean Section - 1  Diagnostic Studies History Molli Hazard K. Boe Deans, MD; 06/22/2018 2:25 PM) Colonoscopy never Mammogram never Pap Smear 1-5 years ago  Allergies Adela Lank [place], RMA; 06/22/2018 11:23 AM) No Known Drug Allergies [06/02/2017]: Allergies Reconciled  Medication History [employer], RMA; 06/22/2018 11:24 AM) Levocetirizine Dihydrochloride (5MG  Tablet, Oral) Active. Montelukast Sodium (10MG  Tablet, Oral) Active. Ibuprofen (600MG  Tablet, Oral) Active. HYDROcodone-Homatropine (5-1.5MG /5ML Syrup, Oral) Active. Medications Reconciled  Social History Janet Duarte. Empress Newmann, MD; 06/22/2018 2:25  PM) Alcohol use Occasional alcohol use. Caffeine use Carbonated beverages. No drug use Tobacco use Never smoker.  Family History Janet Duarte. Felma Pfefferle, MD; 06/22/2018 2:25 PM) Arthritis Mother. Cancer Mother. Cerebrovascular Accident Mother. Diabetes Mellitus Mother. Hypertension Mother.  Pregnancy / Birth History Janet Duarte. Kalese Ensz, MD; 06/22/2018 2:25 PM) Age at menarche 13 years. Gravida 1 Maternal age 25-35 Para 1 Regular periods    Vitals Adela Lank Haggett RMA; 06/22/2018 11:25 AM) 06/22/2018 11:25 AM Height: 70in Temp.: 97.51F(Temporal)  Pulse: 72 (Regular)  P.OX: 99% (Room air) BP: 140/92 (Sitting, Right Arm, Standard)      Physical Exam Molli Hazard K. Kissie Ziolkowski MD; 06/22/2018 2:23 PM)  The physical exam findings are as follows: Note:WDWN in NAD Eyes: Pupils equal, round; sclera anicteric HENT: Oral mucosa moist; good dentition Neck: No masses palpated, no thyromegaly Lungs: CTA bilaterally; normal respiratory effort CV: Regular rate and rhythm; no murmurs; extremities well-perfused with no edema Abd: +bowel sounds, soft, non-tender, no palpable organomegaly Well-healed infraumbilical incision with no sign of recurrent hernia in this area Approximately 2 cm above the umbilicu there is a protruding bulge with Valsalva maneuver. This is reducible. The entire hernia bulge measures about 2 cm. It is reducible. The defect seems to be about a centimeter across. Skin: Warm, dry; no sign of jaundice Psychiatric - alert and oriented x 4; calm mood and affect    Assessment & Plan Molli Hazard K. Lyliana Dicenso MD; 06/22/2018 11:23 AM)  VENTRAL HERNIA WITHOUT OBSTRUCTION OR GANGRENE (K43.9) Impression: Supraumbilical - about 2 cm above umbilicus. 2 cm diameter hernia sac  Current Plans Schedule for Surgery - open repair of supraumbilical ventral hernia with mesh. The surgical procedure has been discussed with the patient. Potential risks, benefits, alternative  treatments, and expected outcomes have  been explained. All of the patient's questions at this time have been answered. The likelihood of reaching the patient's treatment goal is good. The patient understand the proposed surgical procedure and wishes to proceed.  Janet ArmsMatthew K. Corliss Skainssuei, MD, [facility]  General/ Trauma Surgery Beeper [phone]  06/22/2018 2:25 PM

## 2019-01-01 DIAGNOSIS — J302 Other seasonal allergic rhinitis: Secondary | ICD-10-CM | POA: Insufficient documentation

## 2019-01-13 ENCOUNTER — Other Ambulatory Visit: Payer: Self-pay | Admitting: Student

## 2019-01-13 DIAGNOSIS — Z1231 Encounter for screening mammogram for malignant neoplasm of breast: Secondary | ICD-10-CM

## 2019-02-10 ENCOUNTER — Other Ambulatory Visit: Payer: Self-pay | Admitting: Student

## 2019-02-10 DIAGNOSIS — Z1231 Encounter for screening mammogram for malignant neoplasm of breast: Secondary | ICD-10-CM

## 2019-02-24 ENCOUNTER — Other Ambulatory Visit: Payer: Self-pay

## 2019-02-24 ENCOUNTER — Encounter (INDEPENDENT_AMBULATORY_CARE_PROVIDER_SITE_OTHER): Payer: Self-pay

## 2019-02-24 ENCOUNTER — Ambulatory Visit
Admission: RE | Admit: 2019-02-24 | Discharge: 2019-02-24 | Disposition: A | Discharge status: Home / Self Care | Payer: 59 | Source: Ambulatory Visit | Attending: Family Medicine | Admitting: Family Medicine

## 2019-02-24 DIAGNOSIS — Z1231 Encounter for screening mammogram for malignant neoplasm of breast: Secondary | ICD-10-CM | POA: Diagnosis present

## 2021-01-25 ENCOUNTER — Other Ambulatory Visit: Payer: Self-pay | Admitting: Student

## 2021-01-25 DIAGNOSIS — Z8041 Family history of malignant neoplasm of ovary: Secondary | ICD-10-CM | POA: Insufficient documentation

## 2021-01-25 DIAGNOSIS — Z1231 Encounter for screening mammogram for malignant neoplasm of breast: Secondary | ICD-10-CM

## 2021-02-21 ENCOUNTER — Other Ambulatory Visit: Payer: Self-pay

## 2021-02-21 ENCOUNTER — Ambulatory Visit
Admission: RE | Admit: 2021-02-21 | Discharge: 2021-02-21 | Disposition: A | Discharge status: Home / Self Care | Payer: 59 | Source: Ambulatory Visit | Attending: Student | Admitting: Student

## 2021-02-21 DIAGNOSIS — Z1231 Encounter for screening mammogram for malignant neoplasm of breast: Secondary | ICD-10-CM | POA: Diagnosis not present

## 2022-02-15 ENCOUNTER — Other Ambulatory Visit: Payer: Self-pay | Admitting: Student

## 2022-02-15 DIAGNOSIS — Z1231 Encounter for screening mammogram for malignant neoplasm of breast: Secondary | ICD-10-CM

## 2022-02-18 ENCOUNTER — Encounter (HOSPITAL_COMMUNITY): Payer: Self-pay | Admitting: Emergency Medicine

## 2022-02-18 ENCOUNTER — Emergency Department (HOSPITAL_COMMUNITY)
Admission: EM | Admit: 2022-02-18 | Discharge: 2022-02-18 | Disposition: A | Discharge status: Home / Self Care | Payer: 59 | Attending: Emergency Medicine | Admitting: Emergency Medicine

## 2022-02-18 ENCOUNTER — Emergency Department (HOSPITAL_BASED_OUTPATIENT_CLINIC_OR_DEPARTMENT_OTHER): Payer: 59

## 2022-02-18 DIAGNOSIS — M79604 Pain in right leg: Secondary | ICD-10-CM | POA: Insufficient documentation

## 2022-02-18 DIAGNOSIS — M79661 Pain in right lower leg: Secondary | ICD-10-CM

## 2022-02-18 NOTE — ED Triage Notes (Signed)
Patient reports sent from UC for further evaluation of elevated dimer and RLE pain x5 days. Patient denies chest pain and SOB.

## 2022-02-18 NOTE — ED Provider Notes (Signed)
[facility]-EMERGENCY DEPT Provider Note   CSN: [id number] Arrival date & time: 02/18/22  1243     History  Chief Complaint  Patient presents with   Leg Pain    Janet Duarte is a 46 y.o. female.  HPI 46 year old female presents with right leg pain.  This been ongoing for about 5 or 6 days.  No trauma.  Has progressively worsened.  Is primarily in her popliteal area and goes down her calf.  Feels like it might of been swollen yesterday but not really today.  No chest pain or shortness of breath.  She went to urgent care shortly after it started and they did some blood work, which resulted in elevated D-dimer and she was told to come here for an ultrasound.  Home Medications Prior to Admission medications   Medication Sig Start Date End Date Taking? Authorizing Provider  fluticasone (FLONASE) 50 MCG/ACT nasal spray Place 2 sprays into both nostrils daily. 11/02/15   Leslye Peer, MD  HYDROcodone-acetaminophen (NORCO/VICODIN) 5-325 MG tablet Take 1 tablet by mouth every 6 (six) hours as needed for moderate pain. 08/19/17   Manus Rudd, MD  loratadine (CLARITIN) 10 MG tablet Take 10 mg by mouth daily.    [provider]  montelukast (SINGULAIR) 10 MG tablet Take 10 mg by mouth at bedtime.    [provider]      Allergies    Patient has no known allergies.    Review of Systems   Review of Systems  Constitutional:  Negative for fever.  Respiratory:  Negative for shortness of breath.   Cardiovascular:  Positive for leg swelling. Negative for chest pain.  Musculoskeletal:  Positive for myalgias.    Physical Exam Updated Vital Signs BP 112/75   Pulse 60   Temp 98.2 F (36.8 C) (Oral)   Resp 16   Ht 5\' 6"  (1.676 m)   Wt 76.2 kg   LMP 02/11/2022 (Approximate)   SpO2 98%   BMI 27.12 kg/m  Physical Exam Vitals and nursing note reviewed.  Constitutional:      Appearance: She is well-developed.  HENT:     Head: Normocephalic and  atraumatic.  Cardiovascular:     Rate and Rhythm: Normal rate and regular rhythm.     Pulses:          Dorsalis pedis pulses are 2+ on the right side.       Posterior tibial pulses are 2+ on the right side.  Pulmonary:     Effort: Pulmonary effort is normal.  Abdominal:     General: There is no distension.  Musculoskeletal:     Comments: Mild tenderness to the right popliteal area as well as in the calf.  No erythema, swelling.  No joint effusion or pain with range of motion of the knee  Skin:    General: Skin is warm and dry.     Findings: No erythema.  Neurological:     Mental Status: She is alert.     ED Results / Procedures / Treatments   Labs (all labs ordered are listed, but only abnormal results are displayed) Labs Reviewed - No data to display  EKG None  Radiology VAS 02/13/2022 LOWER EXTREMITY VENOUS (DVT) (7a-7p)  Result Date: 02/18/2022  Lower Venous DVT Study Patient Name:  Janet Duarte  Date of Exam:   02/18/2022 Medical Rec #: [id number]       Accession #:    [id number] Date of Birth:  1976-01-06       Patient Gender: F Patient Age:   11 years Exam Location:  [facility] Procedure:      VAS [place] LOWER EXTREMITY VENOUS (DVT) Referring Phys: Lorin Picket Kimball Appleby --------------------------------------------------------------------------------  Indications: Pain.  Risk Factors: None identified. Comparison Study: No prior studies. Performing Technologist: Chanda Busing RVT  Examination Guidelines: A complete evaluation includes B-mode imaging, spectral Doppler, color Doppler, and power Doppler as needed of all accessible portions of each vessel. Bilateral testing is considered an integral part of a complete examination. Limited examinations for reoccurring indications may be performed as noted. The reflux portion of the exam is performed with the patient in reverse Trendelenburg.  +---------+---------------+---------+-----------+----------+--------------+ RIGHT     CompressibilityPhasicitySpontaneityPropertiesThrombus Aging +---------+---------------+---------+-----------+----------+--------------+ CFV      Full           Yes      Yes                                 +---------+---------------+---------+-----------+----------+--------------+ SFJ      Full                                                        +---------+---------------+---------+-----------+----------+--------------+ FV Prox  Full                                                        +---------+---------------+---------+-----------+----------+--------------+ FV Mid   Full                                                        +---------+---------------+---------+-----------+----------+--------------+ FV DistalFull                                                        +---------+---------------+---------+-----------+----------+--------------+ PFV      Full                                                        +---------+---------------+---------+-----------+----------+--------------+ POP      Full           Yes      Yes                                 +---------+---------------+---------+-----------+----------+--------------+ PTV      Full                                                        +---------+---------------+---------+-----------+----------+--------------+  PERO     Full                                                        +---------+---------------+---------+-----------+----------+--------------+   +----+---------------+---------+-----------+----------+--------------+ LEFTCompressibilityPhasicitySpontaneityPropertiesThrombus Aging +----+---------------+---------+-----------+----------+--------------+ CFV Full           Yes      Yes                                 +----+---------------+---------+-----------+----------+--------------+     Summary: RIGHT: - There is no evidence of deep vein thrombosis in the lower  extremity.  - No cystic structure found in the popliteal fossa.  LEFT: - No evidence of common femoral vein obstruction.  *See table(s) above for measurements and observations. Electronically signed by Sherald Hess MD on 02/18/2022 at 3:52:41 PM.    Final     Procedures Procedures    Medications Ordered in ED Medications - No data to display  ED Course/ Medical Decision Making/ A&P                           Medical Decision Making  Patient is NV intact. DVT ultrasound is negative. Unclear cause of pain. No bony pain. Will treat conservatively. NSAIDs, heat/ice. Given return precautions.         Final Clinical Impression(s) / ED Diagnoses Final diagnoses:  Right calf pain    Rx / DC Orders ED Discharge Orders     None         Pricilla Loveless, MD 02/18/22 1657

## 2022-02-18 NOTE — Discharge Instructions (Addendum)
If you develop new or worsening pain, leg swelling, or any other new/concerning symptoms then return to the ER for evaluation

## 2022-02-18 NOTE — Progress Notes (Signed)
Right lower extremity venous duplex has been completed. Preliminary results can be found in CV Proc through chart review.  Results were given to Dr. Criss Alvine.  02/18/22 2:31 PM Olen Cordial RVT

## 2022-02-22 IMAGING — MG MM DIGITAL SCREENING BILAT W/ TOMO AND CAD
8 series · 8 of 24 positions shown · non-contrast
Comparison: Previous exam(s).

CLINICAL DATA: Screening.

EXAM:
DIGITAL SCREENING BILATERAL MAMMOGRAM WITH TOMOSYNTHESIS AND CAD
TECHNIQUE: Bilateral screening digital craniocaudal and mediolateral oblique
mammograms were obtained. Bilateral screening digital breast
tomosynthesis was performed. The images were evaluated with
computer-aided detection.

[L MLO synth-2D]
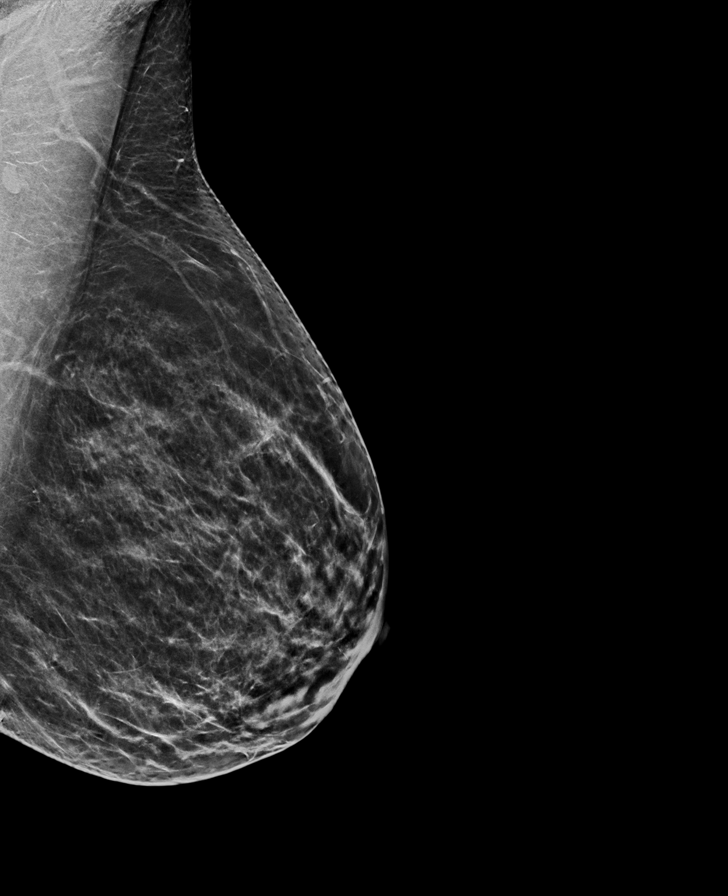

[R MLO synth-2D]
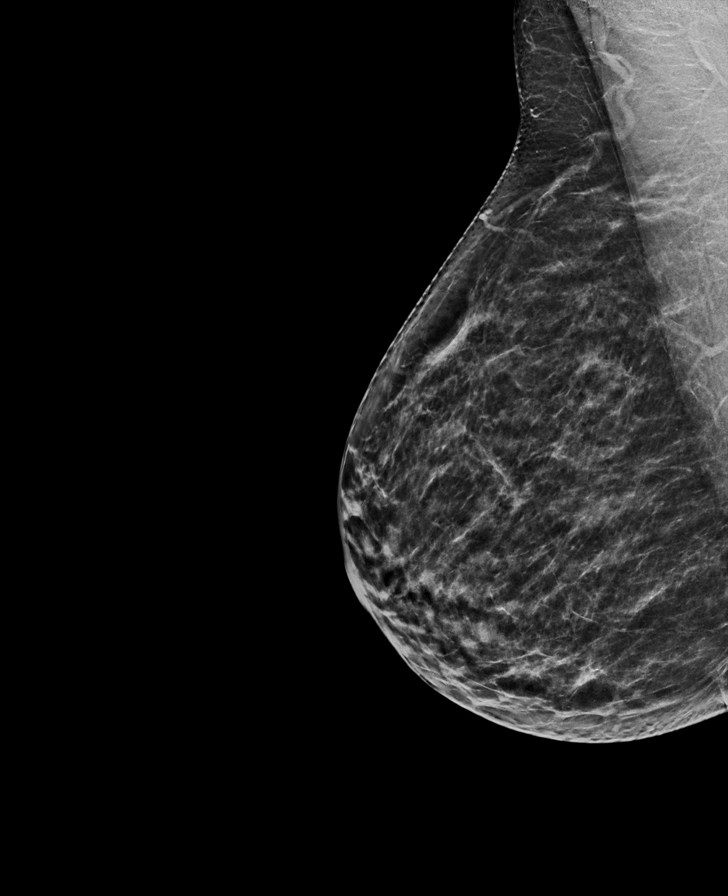

[L CC synth-2D]
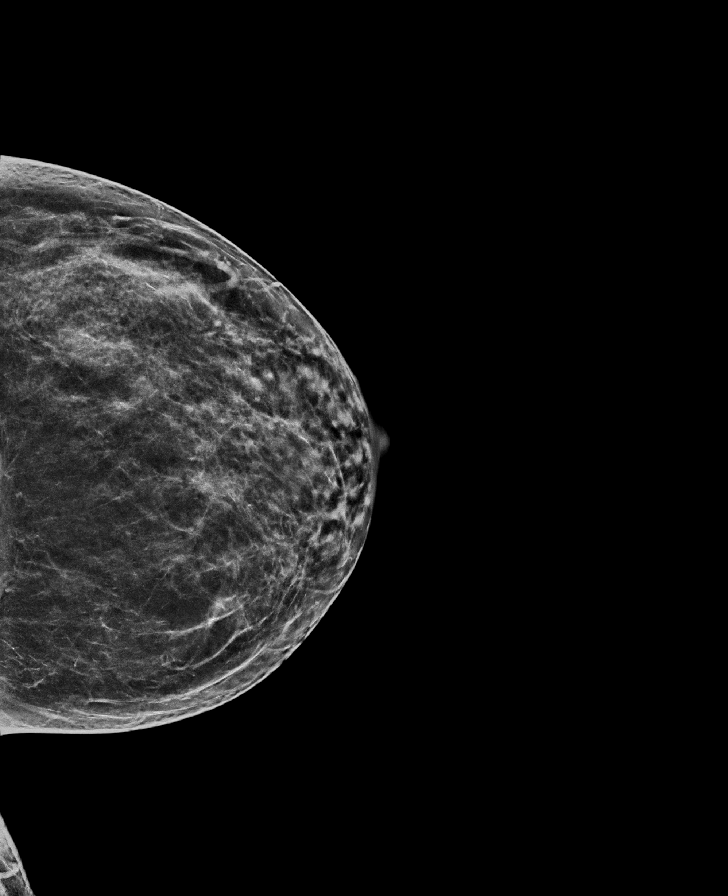

[R CC synth-2D]
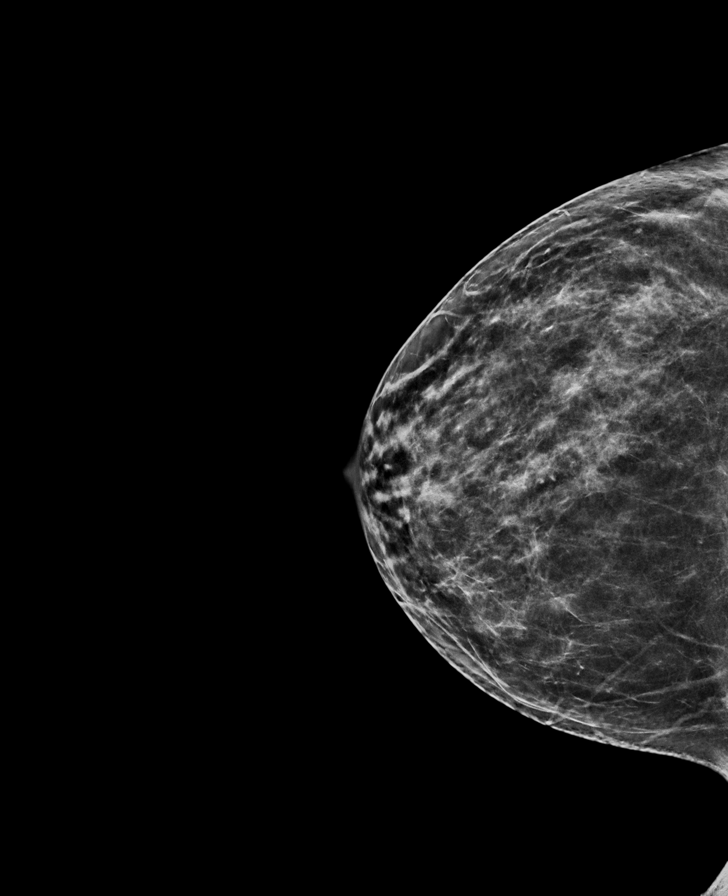

[L MLO tomo · tomo slice 35/69.0]
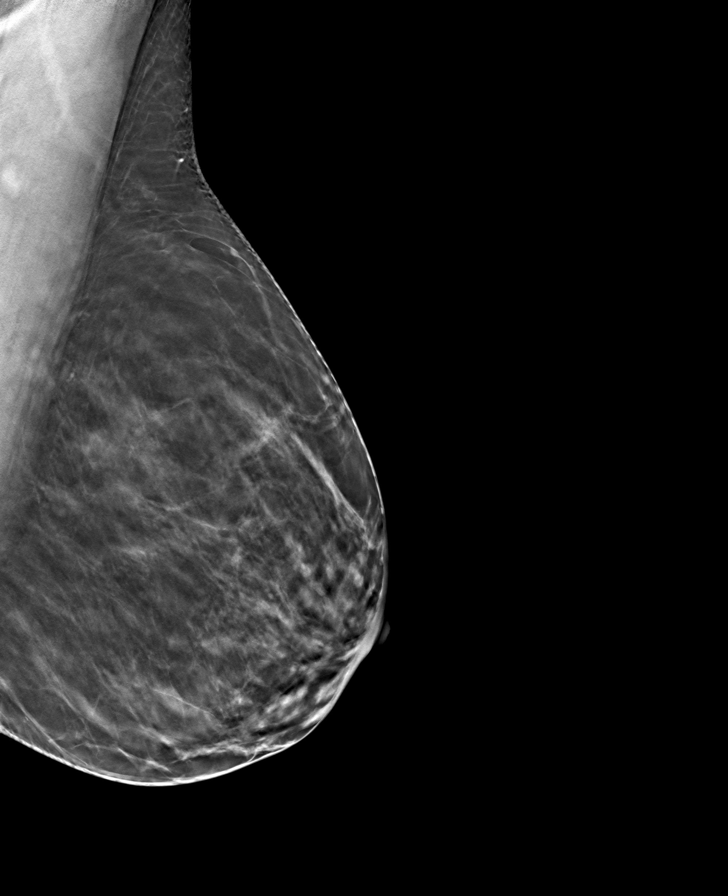

[L CC tomo · tomo slice 35/68.0]
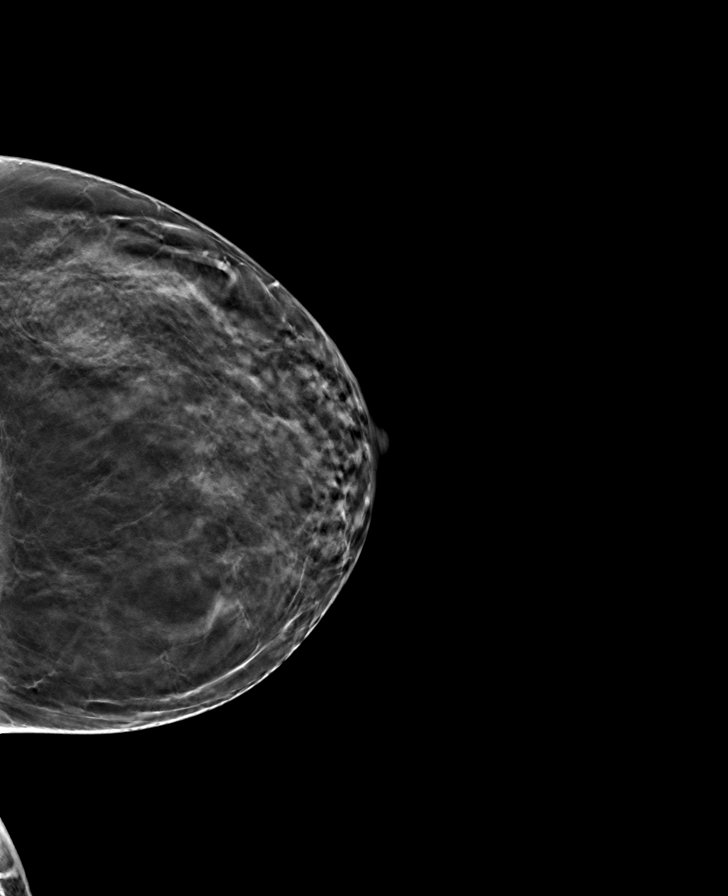

[R CC tomo · tomo slice 36/71.0]
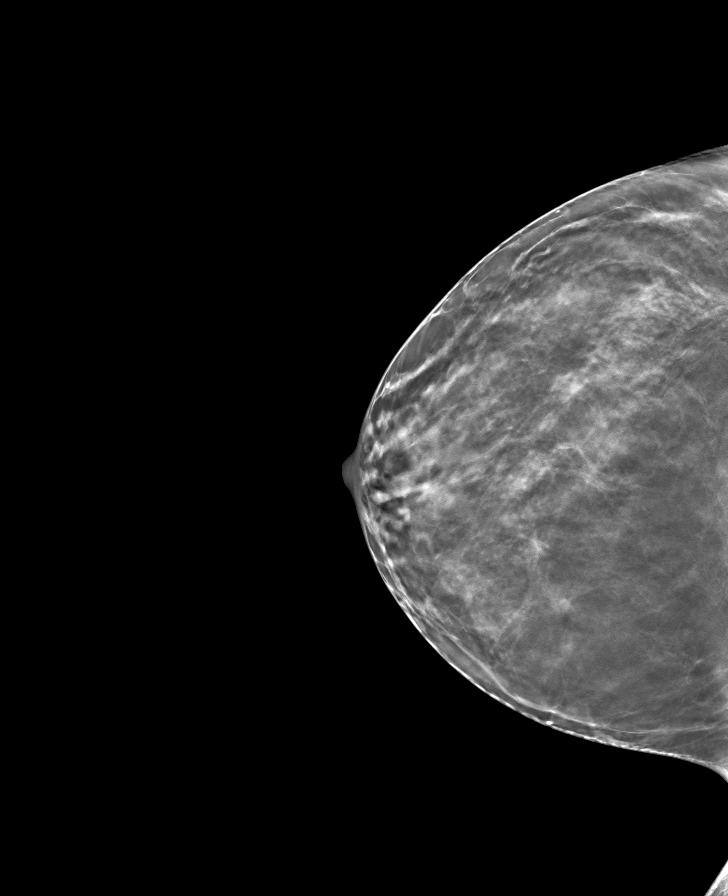

[R MLO tomo · tomo slice 36/71.0]
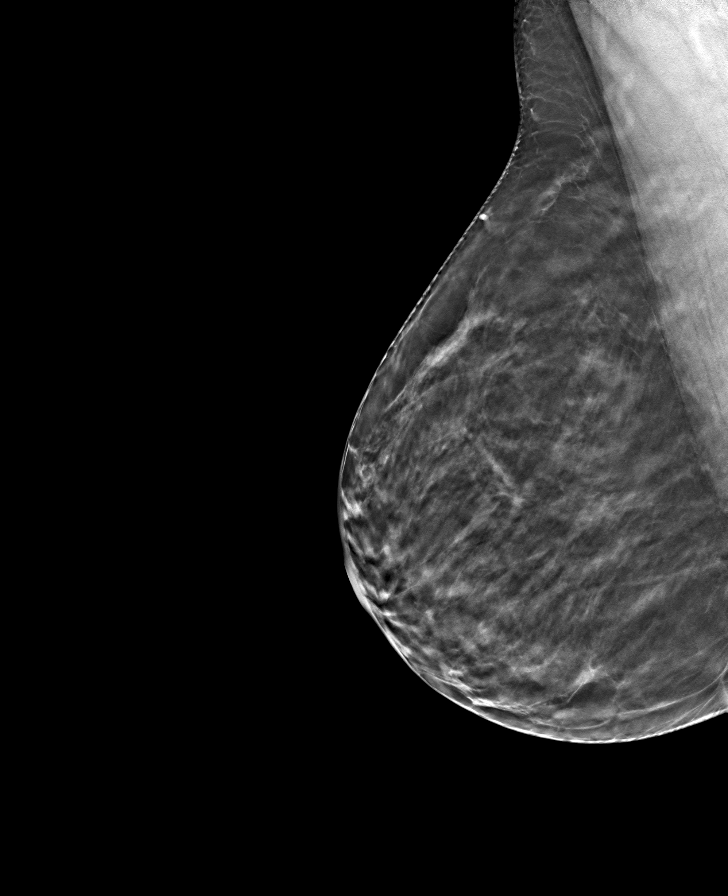

[8 of 24 positions shown; findings below may reference images not displayed]

ACR Breast Density Category c: The breast tissue is heterogeneously
dense, which may obscure small masses.
FINDINGS: There are no findings suspicious for malignancy.
IMPRESSION: No mammographic evidence of malignancy. A result letter of this
screening mammogram will be mailed directly to the patient.

RECOMMENDATION:
Screening mammogram in one year. (Code:Q3-W-BC3)

BI-RADS CATEGORY  1: Negative.

## 2022-02-28 DIAGNOSIS — M25561 Pain in right knee: Secondary | ICD-10-CM | POA: Insufficient documentation

## 2022-03-07 ENCOUNTER — Ambulatory Visit
Admission: RE | Admit: 2022-03-07 | Discharge: 2022-03-07 | Disposition: A | Discharge status: Home / Self Care | Payer: 59 | Source: Ambulatory Visit | Attending: Student | Admitting: Student

## 2022-03-07 DIAGNOSIS — Z1231 Encounter for screening mammogram for malignant neoplasm of breast: Secondary | ICD-10-CM | POA: Diagnosis not present

## 2022-04-05 DIAGNOSIS — M7121 Synovial cyst of popliteal space [Baker], right knee: Secondary | ICD-10-CM | POA: Insufficient documentation

## 2023-04-30 ENCOUNTER — Other Ambulatory Visit: Payer: Self-pay | Admitting: Student

## 2023-04-30 DIAGNOSIS — Z1231 Encounter for screening mammogram for malignant neoplasm of breast: Secondary | ICD-10-CM

## 2023-05-08 ENCOUNTER — Ambulatory Visit
Admission: RE | Admit: 2023-05-08 | Discharge: 2023-05-08 | Disposition: A | Discharge status: Home / Self Care | Payer: 59 | Source: Ambulatory Visit | Attending: Student | Admitting: Student

## 2023-05-08 DIAGNOSIS — Z1231 Encounter for screening mammogram for malignant neoplasm of breast: Secondary | ICD-10-CM | POA: Insufficient documentation

## 2023-06-21 ENCOUNTER — Ambulatory Visit
Admission: EM | Admit: 2023-06-21 | Discharge: 2023-06-21 | Disposition: A | Discharge status: Home / Self Care | Payer: 59

## 2023-06-21 DIAGNOSIS — M62830 Muscle spasm of back: Secondary | ICD-10-CM | POA: Diagnosis not present

## 2023-06-21 DIAGNOSIS — M545 Low back pain, unspecified: Secondary | ICD-10-CM | POA: Diagnosis not present

## 2023-06-21 MED ORDER — CYCLOBENZAPRINE HCL 10 MG PO TABS: 10.0000 mg | ORAL_TABLET | Freq: Two times a day (BID) | ORAL | 0 refills | Status: DC | PRN

## 2023-06-21 MED ORDER — PREDNISONE 20 MG PO TABS: 40.0000 mg | ORAL_TABLET | Freq: Every day | ORAL | 0 refills | Status: AC
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
## 2023-07-29 ENCOUNTER — Encounter: Payer: Self-pay | Admitting: Rehabilitative and Restorative Service Providers"

## 2023-07-29 ENCOUNTER — Ambulatory Visit: Payer: 59 | Attending: Student | Admitting: Rehabilitative and Restorative Service Providers"

## 2023-07-29 ENCOUNTER — Other Ambulatory Visit: Payer: Self-pay

## 2023-07-29 DIAGNOSIS — M6281 Muscle weakness (generalized): Secondary | ICD-10-CM | POA: Diagnosis present

## 2023-07-29 DIAGNOSIS — R252 Cramp and spasm: Secondary | ICD-10-CM

## 2023-07-29 DIAGNOSIS — M5459 Other low back pain: Secondary | ICD-10-CM | POA: Diagnosis present

## 2023-07-29 NOTE — Patient Instructions (Signed)
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
## 2023-08-05 ENCOUNTER — Ambulatory Visit: Payer: 59 | Attending: Student

## 2023-08-05 DIAGNOSIS — R293 Abnormal posture: Secondary | ICD-10-CM | POA: Insufficient documentation

## 2023-08-05 DIAGNOSIS — M6281 Muscle weakness (generalized): Secondary | ICD-10-CM | POA: Diagnosis present

## 2023-08-05 DIAGNOSIS — R262 Difficulty in walking, not elsewhere classified: Secondary | ICD-10-CM | POA: Diagnosis present

## 2023-08-05 DIAGNOSIS — M5459 Other low back pain: Secondary | ICD-10-CM | POA: Insufficient documentation

## 2023-08-05 DIAGNOSIS — R252 Cramp and spasm: Secondary | ICD-10-CM | POA: Insufficient documentation
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
## 2023-08-07 ENCOUNTER — Ambulatory Visit: Payer: 59 | Admitting: Rehabilitative and Restorative Service Providers"

## 2023-08-07 ENCOUNTER — Encounter: Payer: Self-pay | Admitting: Rehabilitative and Restorative Service Providers"

## 2023-08-07 DIAGNOSIS — R252 Cramp and spasm: Secondary | ICD-10-CM

## 2023-08-07 DIAGNOSIS — M5459 Other low back pain: Secondary | ICD-10-CM

## 2023-08-07 DIAGNOSIS — R293 Abnormal posture: Secondary | ICD-10-CM

## 2023-08-07 DIAGNOSIS — M6281 Muscle weakness (generalized): Secondary | ICD-10-CM

## 2023-08-07 DIAGNOSIS — R262 Difficulty in walking, not elsewhere classified: Secondary | ICD-10-CM
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
## 2023-08-12 ENCOUNTER — Ambulatory Visit: Payer: 59 | Admitting: Rehabilitative and Restorative Service Providers"

## 2023-08-12 ENCOUNTER — Encounter: Payer: Self-pay | Admitting: Rehabilitative and Restorative Service Providers"

## 2023-08-12 DIAGNOSIS — M5459 Other low back pain: Secondary | ICD-10-CM | POA: Diagnosis not present

## 2023-08-12 DIAGNOSIS — R262 Difficulty in walking, not elsewhere classified: Secondary | ICD-10-CM

## 2023-08-12 DIAGNOSIS — M6281 Muscle weakness (generalized): Secondary | ICD-10-CM

## 2023-08-12 DIAGNOSIS — R293 Abnormal posture: Secondary | ICD-10-CM

## 2023-08-12 DIAGNOSIS — R252 Cramp and spasm: Secondary | ICD-10-CM
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
## 2023-08-14 ENCOUNTER — Ambulatory Visit: Payer: 59 | Admitting: Rehabilitative and Restorative Service Providers"

## 2023-08-18 ENCOUNTER — Ambulatory Visit: Payer: 59 | Admitting: Rehabilitative and Restorative Service Providers"

## 2023-08-18 ENCOUNTER — Encounter: Payer: Self-pay | Admitting: Rehabilitative and Restorative Service Providers"

## 2023-08-18 DIAGNOSIS — R252 Cramp and spasm: Secondary | ICD-10-CM

## 2023-08-18 DIAGNOSIS — R293 Abnormal posture: Secondary | ICD-10-CM

## 2023-08-18 DIAGNOSIS — R262 Difficulty in walking, not elsewhere classified: Secondary | ICD-10-CM

## 2023-08-18 DIAGNOSIS — M6281 Muscle weakness (generalized): Secondary | ICD-10-CM

## 2023-08-18 DIAGNOSIS — M5459 Other low back pain: Secondary | ICD-10-CM | POA: Diagnosis not present
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
## 2023-08-20 ENCOUNTER — Ambulatory Visit: Payer: 59

## 2023-08-26 ENCOUNTER — Ambulatory Visit: Payer: 59

## 2023-08-26 DIAGNOSIS — M5459 Other low back pain: Secondary | ICD-10-CM | POA: Diagnosis not present

## 2023-08-26 DIAGNOSIS — M6281 Muscle weakness (generalized): Secondary | ICD-10-CM

## 2023-08-26 DIAGNOSIS — R293 Abnormal posture: Secondary | ICD-10-CM

## 2023-08-26 DIAGNOSIS — R252 Cramp and spasm: Secondary | ICD-10-CM

## 2023-08-26 DIAGNOSIS — R262 Difficulty in walking, not elsewhere classified: Secondary | ICD-10-CM
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
## 2023-08-28 ENCOUNTER — Ambulatory Visit: Payer: 59 | Admitting: Rehabilitative and Restorative Service Providers"

## 2023-08-28 ENCOUNTER — Encounter: Payer: Self-pay | Admitting: Rehabilitative and Restorative Service Providers"

## 2023-08-28 DIAGNOSIS — M5459 Other low back pain: Secondary | ICD-10-CM

## 2023-08-28 DIAGNOSIS — R252 Cramp and spasm: Secondary | ICD-10-CM

## 2023-08-28 DIAGNOSIS — M6281 Muscle weakness (generalized): Secondary | ICD-10-CM
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
## 2023-09-02 ENCOUNTER — Ambulatory Visit: Payer: 59 | Attending: Student | Admitting: Rehabilitative and Restorative Service Providers"

## 2023-09-02 ENCOUNTER — Encounter: Payer: Self-pay | Admitting: Rehabilitative and Restorative Service Providers"

## 2023-09-02 DIAGNOSIS — M6281 Muscle weakness (generalized): Secondary | ICD-10-CM | POA: Insufficient documentation

## 2023-09-02 DIAGNOSIS — R252 Cramp and spasm: Secondary | ICD-10-CM | POA: Diagnosis present

## 2023-09-02 DIAGNOSIS — M5459 Other low back pain: Secondary | ICD-10-CM | POA: Diagnosis present
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
## 2023-09-04 ENCOUNTER — Ambulatory Visit: Payer: 59 | Admitting: Rehabilitative and Restorative Service Providers"

## 2023-09-04 ENCOUNTER — Encounter: Payer: Self-pay | Admitting: Rehabilitative and Restorative Service Providers"

## 2023-09-04 DIAGNOSIS — R252 Cramp and spasm: Secondary | ICD-10-CM

## 2023-09-04 DIAGNOSIS — M5459 Other low back pain: Secondary | ICD-10-CM | POA: Diagnosis not present

## 2023-09-04 DIAGNOSIS — M6281 Muscle weakness (generalized): Secondary | ICD-10-CM
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
## 2023-09-08 ENCOUNTER — Encounter: Payer: 59 | Admitting: Rehabilitative and Restorative Service Providers"

## 2023-09-10 ENCOUNTER — Ambulatory Visit: Payer: 59 | Admitting: Rehabilitative and Restorative Service Providers"

## 2023-09-10 ENCOUNTER — Encounter: Payer: Self-pay | Admitting: Rehabilitative and Restorative Service Providers"

## 2023-09-10 DIAGNOSIS — M5459 Other low back pain: Secondary | ICD-10-CM | POA: Diagnosis not present

## 2023-09-10 DIAGNOSIS — M6281 Muscle weakness (generalized): Secondary | ICD-10-CM

## 2023-09-10 DIAGNOSIS — R252 Cramp and spasm: Secondary | ICD-10-CM
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
## 2023-09-16 ENCOUNTER — Encounter: Payer: 59 | Admitting: Rehabilitative and Restorative Service Providers"

## 2023-09-18 ENCOUNTER — Encounter: Payer: 59 | Admitting: Rehabilitative and Restorative Service Providers"

## 2023-09-23 ENCOUNTER — Encounter: Payer: 59 | Admitting: Rehabilitative and Restorative Service Providers"

## 2023-09-25 ENCOUNTER — Encounter: Payer: 59 | Admitting: Rehabilitative and Restorative Service Providers"

## 2023-12-01 ENCOUNTER — Ambulatory Visit (INDEPENDENT_AMBULATORY_CARE_PROVIDER_SITE_OTHER): Admitting: Podiatry

## 2023-12-01 ENCOUNTER — Ambulatory Visit (INDEPENDENT_AMBULATORY_CARE_PROVIDER_SITE_OTHER)

## 2023-12-01 ENCOUNTER — Encounter: Payer: Self-pay | Admitting: Podiatry

## 2023-12-01 VITALS — Ht 66.0 in | Wt 165.0 lb

## 2023-12-01 DIAGNOSIS — M722 Plantar fascial fibromatosis: Secondary | ICD-10-CM | POA: Diagnosis not present

## 2023-12-01 DIAGNOSIS — M779 Enthesopathy, unspecified: Secondary | ICD-10-CM | POA: Diagnosis not present

## 2023-12-01 DIAGNOSIS — B070 Plantar wart: Secondary | ICD-10-CM

## 2023-12-01 NOTE — Patient Instructions (Addendum)
 VISIT SUMMARY: Today, you were seen for right heel pain and symptoms of plantar fasciitis, as well as a plantar wart on your left foot. We discussed your symptoms, reviewed your treatment options, and provided immediate care for both conditions.  YOUR PLAN: -PLANTAR FASCIITIS AND ACHILLES TENDONITIS: Plantar fasciitis and Achilles tendonitis are conditions that cause pain in the heel and are often related to tight calf muscles. We administered a corticosteroid injection to your right heel to reduce inflammation and prescribed meloxicam to manage pain. You should avoid taking Aleve  or Motrin  while on meloxicam. Additionally, you were given a home physical therapy plan to perform twice daily, advised to use ice to massage the area, and to continue using arch supports. If your symptoms do not improve by 50% in one month, please report back for further evaluation.  -VERRUCA PLANTARIS (PLANTAR WART): A plantar wart is a growth on the sole of the foot caused by a virus. We performed a debridement to remove the central core of the wart and applied a salicylic acid treatment. You should leave the bandage on for 24 hours and then apply the treatment nightly after work, covering it with a Band-Aid. Wash the area with soap and water before work the next day. If you cannot find 40% salicylic acid wart remover locally, consider purchasing it from [employer].  INSTRUCTIONS: Please follow up in one month if your heel pain does not improve by 50%. If there is no improvement in six months, we may consider further interventions such as a walking boot, time off work, physical therapy, or an MRI. For the plantar wart, continue the nightly salicylic acid treatment and monitor the area for any changes.                      Contains text generated by [employer]bridge.                                 Contains text generated by Abridge.   Plantar Fasciitis (Heel Spur Syndrome) with  Rehab The plantar fascia is a fibrous, ligament-like, soft-tissue structure that spans the bottom of the foot. Plantar fasciitis is a condition that causes pain in the foot due to inflammation of the tissue. SYMPTOMS  Pain and tenderness on the underneath side of the foot. Pain that worsens with standing or walking. CAUSES  Plantar fasciitis is caused by irritation and injury to the plantar fascia on the underneath side of the foot. Common mechanisms of injury include: Direct trauma to bottom of the foot. Damage to a small nerve that runs under the foot where the main fascia attaches to the heel bone. Stress placed on the plantar fascia due to bone spurs. RISK INCREASES WITH:  Activities that place stress on the plantar fascia (running, jumping, pivoting, or cutting). Poor strength and flexibility. Improperly fitted shoes. Tight calf muscles. Flat feet. Failure to warm-up properly before activity. Obesity. PREVENTION Warm up and stretch properly before activity. Allow for adequate recovery between workouts. Maintain physical fitness: Strength, flexibility, and endurance. Cardiovascular fitness. Maintain a health body weight. Avoid stress on the plantar fascia. Wear properly fitted shoes, including arch supports for individuals who have flat feet.  PROGNOSIS  If treated properly, then the symptoms of plantar fasciitis usually resolve without surgery. However, occasionally surgery is necessary.  RELATED COMPLICATIONS  Recurrent symptoms that may result in a chronic condition. Problems of the lower back that are  caused by compensating for the injury, such as limping. Pain or weakness of the foot during push-off following surgery. Chronic inflammation, scarring, and partial or complete fascia tear, occurring more often from repeated injections.  TREATMENT  Treatment initially involves the use of ice and medication to help reduce pain and inflammation. The use of strengthening and  stretching exercises may help reduce pain with activity, especially stretches of the Achilles tendon. These exercises may be performed at home or with a therapist. Your caregiver may recommend that you use heel cups of arch supports to help reduce stress on the plantar fascia. Occasionally, corticosteroid injections are given to reduce inflammation. If symptoms persist for greater than 6 months despite non-surgical (conservative), then surgery may be recommended.   MEDICATION  If pain medication is necessary, then nonsteroidal anti-inflammatory medications, such as aspirin and ibuprofen , or other minor pain relievers, such as acetaminophen , are often recommended. Do not take pain medication within 7 days before surgery. Prescription pain relievers may be given if deemed necessary by your caregiver. Use only as directed and only as much as you need. Corticosteroid injections may be given by your caregiver. These injections should be reserved for the most serious cases, because they may only be given a certain number of times.  HEAT AND COLD Cold treatment (icing) relieves pain and reduces inflammation. Cold treatment should be applied for 10 to 15 minutes every 2 to 3 hours for inflammation and pain and immediately after any activity that aggravates your symptoms. Use ice packs or massage the area with a piece of ice (ice massage). Heat treatment may be used prior to performing the stretching and strengthening activities prescribed by your caregiver, physical therapist, or athletic trainer. Use a heat pack or soak the injury in warm water.  SEEK IMMEDIATE MEDICAL CARE IF: Treatment seems to offer no benefit, or the condition worsens. Any medications produce adverse side effects.  EXERCISES- RANGE OF MOTION (ROM) AND STRETCHING EXERCISES - Plantar Fasciitis (Heel Spur Syndrome) These exercises may help you when beginning to rehabilitate your injury. Your symptoms may resolve with or without further  involvement from your physician, physical therapist or athletic trainer. While completing these exercises, remember:  Restoring tissue flexibility helps normal motion to return to the joints. This allows healthier, less painful movement and activity. An effective stretch should be held for at least 30 seconds. A stretch should never be painful. You should only feel a gentle lengthening or release in the stretched tissue.  RANGE OF MOTION - Toe Extension, Flexion Sit with your right / left leg crossed over your opposite knee. Grasp your toes and gently pull them back toward the top of your foot. You should feel a stretch on the bottom of your toes and/or foot. Hold this stretch for 10 seconds. Now, gently pull your toes toward the bottom of your foot. You should feel a stretch on the top of your toes and or foot. Hold this stretch for 10 seconds. Repeat  times. Complete this stretch 3 times per day.   RANGE OF MOTION - Ankle Dorsiflexion, Active Assisted Remove shoes and sit on a chair that is preferably not on a carpeted surface. Place right / left foot under knee. Extend your opposite leg for support. Keeping your heel down, slide your right / left foot back toward the chair until you feel a stretch at your ankle or calf. If you do not feel a stretch, slide your bottom forward to the edge of the chair, while  still keeping your heel down. Hold this stretch for 10 seconds. Repeat 3 times. Complete this stretch 2 times per day.   STRETCH  Gastroc, Standing Place hands on wall. Extend right / left leg, keeping the front knee somewhat bent. Slightly point your toes inward on your back foot. Keeping your right / left heel on the floor and your knee straight, shift your weight toward the wall, not allowing your back to arch. You should feel a gentle stretch in the right / left calf. Hold this position for 10 seconds. Repeat 3 times. Complete this stretch 2 times per day.  STRETCH  Soleus,  Standing Place hands on wall. Extend right / left leg, keeping the other knee somewhat bent. Slightly point your toes inward on your back foot. Keep your right / left heel on the floor, bend your back knee, and slightly shift your weight over the back leg so that you feel a gentle stretch deep in your back calf. Hold this position for 10 seconds. Repeat 3 times. Complete this stretch 2 times per day.  STRETCH  Gastrocsoleus, Standing  Note: This exercise can place a lot of stress on your foot and ankle. Please complete this exercise only if specifically instructed by your caregiver.  Place the ball of your right / left foot on a step, keeping your other foot firmly on the same step. Hold on to the wall or a rail for balance. Slowly lift your other foot, allowing your body weight to press your heel down over the edge of the step. You should feel a stretch in your right / left calf. Hold this position for 10 seconds. Repeat this exercise with a slight bend in your right / left knee. Repeat 3 times. Complete this stretch 2 times per day.   STRENGTHENING EXERCISES - Plantar Fasciitis (Heel Spur Syndrome)  These exercises may help you when beginning to rehabilitate your injury. They may resolve your symptoms with or without further involvement from your physician, physical therapist or athletic trainer. While completing these exercises, remember:  Muscles can gain both the endurance and the strength needed for everyday activities through controlled exercises. Complete these exercises as instructed by your physician, physical therapist or athletic trainer. Progress the resistance and repetitions only as guided.  STRENGTH - Towel Curls Sit in a chair positioned on a non-carpeted surface. Place your foot on a towel, keeping your heel on the floor. Pull the towel toward your heel by only curling your toes. Keep your heel on the floor. Repeat 3 times. Complete this exercise 2 times per  day.  STRENGTH - Ankle Inversion Secure one end of a rubber exercise band/tubing to a fixed object (table, pole). Loop the other end around your foot just before your toes. Place your fists between your knees. This will focus your strengthening at your ankle. Slowly, pull your big toe up and in, making sure the band/tubing is positioned to resist the entire motion. Hold this position for 10 seconds. Have your muscles resist the band/tubing as it slowly pulls your foot back to the starting position. Repeat 3 times. Complete this exercises 2 times per day.  Document Released: 07/15/2005 Document Revised: 10/07/2011 Document Reviewed: 10/27/2008 [facility] Patient Information 2014 [place], [place].

## 2023-12-02 MED ORDER — MELOXICAM 15 MG PO TABS: 15.0000 mg | ORAL_TABLET | Freq: Every day | ORAL | 3 refills | Status: AC

## 2023-12-02 NOTE — Progress Notes (Signed)
 Subjective:  Patient ID: Janet Duarte, female    DOB: 21-Jul-1976,  MRN: [id number]  Chief Complaint  Patient presents with   Foot Pain    Pt is here due to right heel pain states it started over a week ago, no injury to foot, can't put pressure on foot without shoe on, works on concrete floors, states she bought insoles and no shoe with no relief. Also concern with left foot callous on the side that is causing her pain.    Discussed the use of AI scribe software for clinical note transcription with the patient, who gave verbal consent to proceed.  History of Present Illness Carey Y Rindfleisch is a 48 year old female who presents with right heel pain and plantar fasciitis symptoms.  She began experiencing right heel pain one to two weeks ago, characterized by an inability to put pressure on the heel. 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 The pain is particularly severe when barefoot, necessitating wearing shoes for relief.  She attempted to alleviate the pain with over-the-counter plantar fasciitis supports from [employer], which provided no significant relief. The pain is worse after periods of rest, such as after sleeping or lying down, and then resuming activity. No pain is reported in other areas of the foot except for the heel, and there is no significant pain in the arch or sides of the foot. The pain is sharp upon palpation of the heel area.  No significant pain in the arch or sides of the foot.      Objective:    Physical Exam VASCULAR: DP and PT pulse palpable. Foot is warm and well-perfused. Capillary fill time is brisk. DERMATOLOGIC: Normal skin turgor, texture, and temperature. No open lesions, rashes, or ulcerations. NEUROLOGIC: Normal sensation to light touch and pressure. No paresthesias on examination. ORTHOPEDIC: Deep involuting Verruca plantaris with  tenderness to palpation at left subminutersal five base area. Sharp pain on palpation at insertion of plantar medial band of plantar fascia at right medial heel. Slight tenderness at insertion of Achilles. Gastrocnemius equinus present. Smooth pain-free range of motion of all examined joints. No ecchymosis or bruising. No gross deformity.   No images are attached to the encounter.    Results Procedure: Corticosteroid injection of right foot Description: The right heel was injected with twenty milligrams of Kenalog, four milligrams of dexamethasone , and five milligrams of Marcaine . It was dressed with a Band-Aid and she tolerated it well. Informed Consent: Following discussion of risks, benefits, and alternatives, verbal consent was given.  Procedure: Debridement of left verruca plantaris with chemical destruction Description: The verruca plantaris was sharply debrided to expose the central core and enucleated sharply with a number twelve scalpel blade. Salicylic acid treatment was applied to destroy the lesion. Informed Consent: Post-care instructions given.  RADIOLOGY Radiograph of the right foot: No fracture, no heel spur, no stress fracture of the heel bone (12/01/2023)   Assessment:   1. Plantar fasciitis of right foot   2. Verruca plantaris      Plan:  Patient was evaluated and treated and all questions answered.  Assessment and Plan Assessment & Plan Plantar fasciitis and Achilles tendonitis Plantar fasciitis and Achilles tendonitis in the right foot with heel pain exacerbated by standing on concrete at work. Symptoms worsen after rest and when barefoot. Radiograph shows no fracture or heel spur. Condition  is related to calf muscle tightness, increasing stress on the plantar fascia and Achilles tendon. Treatment aims to reduce inflammation and improve flexibility. Informed consent obtained for corticosteroid injection, with discussion of potential soreness, bruising, and swelling  at the injection site. Outcome statistics indicate 90% of individuals improve without surgery. Further interventions will be considered if not 50% improved in one month. - Administer corticosteroid injection to the right heel with 20 mg of Kenalog, 4 mg of dexamethasone , and 0.5 mg of Marcaine . - Prescribe meloxicam 15 mg once daily for one month, then as needed. Provide refills. - Advise against taking additional Aleve  or Motrin  while on meloxicam. - Provide home physical therapy plan with exercises to be performed twice daily. - Recommend using ice, such as a frozen water bottle, to massage and reduce inflammation. - Advise continued use of arch supports. - Instruct to report if not 50% improved in one month for consideration of a second injection. - Discuss potential for further interventions, including walking boot, time off work, physical therapy, or MRI if not improved in six months.  Verruca plantaris (Plantar wart) Verruca plantaris on the left foot at the subminutersal five base area, presenting as a deep, involuting lesion with tenderness to palpation. The lesion resembles a wart more than a callus, characterized by a central core and dark circle. Treatment involves debridement and chemical destruction. - Perform debridement of the verruca plantaris with a scalpel to expose and enucleate the central core. - Apply salicylic acid treatment to the lesion. - Instruct to leave the bandage on for 24 hours and apply treatment nightly after work, covering with a Band-Aid. - Advise to wash the area with soap and water before work the next day. - Recommend purchasing 40% salicylic acid wart remover if not available locally, suggest checking [employer].      Return if symptoms worsen or fail to improve.

## 2023-12-30 ENCOUNTER — Ambulatory Visit (INDEPENDENT_AMBULATORY_CARE_PROVIDER_SITE_OTHER): Admitting: Obstetrics and Gynecology

## 2023-12-30 ENCOUNTER — Encounter: Payer: Self-pay | Admitting: Obstetrics and Gynecology

## 2023-12-30 VITALS — BP 104/78 | HR 81 | Ht 68.0 in | Wt 171.0 lb

## 2023-12-30 DIAGNOSIS — N926 Irregular menstruation, unspecified: Secondary | ICD-10-CM | POA: Diagnosis not present

## 2023-12-30 DIAGNOSIS — N951 Menopausal and female climacteric states: Secondary | ICD-10-CM | POA: Insufficient documentation

## 2023-12-30 DIAGNOSIS — N9089 Other specified noninflammatory disorders of vulva and perineum: Secondary | ICD-10-CM | POA: Diagnosis not present

## 2023-12-30 LAB — PREGNANCY, URINE: Preg Test, Ur: NEGATIVE

## 2023-12-30 MED ORDER — DROSPIRENONE-ETHINYL ESTRADIOL 3-0.02 MG PO TABS: 1.0000 | ORAL_TABLET | Freq: Every day | ORAL | 4 refills | Status: AC

## 2023-12-30 NOTE — Assessment & Plan Note (Signed)
+  VMS and irregular periods Start 20mcg COC No contraindications, including hypertension, migraines with aura, smoking, and hx of VTE/DVT. Discussed side effects including break through bleeding, mood changes, weight changes, headache, breast tenderness, nausea, and bloating. Warning signs including vision changes and leg swelling reviewed.

## 2023-12-30 NOTE — Progress Notes (Signed)
 48 y.o. [id number] female here for multiple complaints. Single. Works at [employer].  Patient's last menstrual period was 12/08/2023 (approximate). Period Duration (Days): 7 Period Pattern: (!) Irregular (skips months) Menstrual Flow: Moderate Menstrual Control: Maxi pad, Tampon, Other (Comment) (tampons if heavy; menstrual panties) Dysmenorrhea: (!) Mild Dysmenorrhea Symptoms: Other (Comment), Headache (back aches)  Bump on right side of labia x 1 year, Has occasional swelling. Also she has another bump on right side x 2 week, painful to walk. Used a needle at home to try to drain it. Did not drain. Feels like a cut. Does shave. Use dove and shaving cream.  Discuss perimenopause symptoms, hot flashes on and off during the day x 1 year.  Irregular periods, becoming heavier.  PAP: 03/01/22 NIL, HPV neg Last mammogram: 05/08/23 BI-RADS 1, density B Sexually active: no   GYN HISTORY: No sig hx  OB History  Gravida Para Term Preterm AB Living  1 1 1   1   SAB IAB Ectopic Multiple Live Births      1    # Outcome Date GA Lbr Len/2nd Weight Sex Type Anes PTL Lv  1 Term      CS-Unspec   LIV   Past Medical History:  Diagnosis Date   Environmental allergies    Umbilical hernia    Past Surgical History:  Procedure Laterality Date   CESAREAN SECTION     UMBILICAL HERNIA REPAIR N/A 08/19/2017   Procedure: HERNIA REPAIR UMBILICAL ERAS PATHWAY;  Surgeon: Dareen Ebbing, MD;  Location: [facility];  Service: General;  Laterality: N/A;   VIDEO BRONCHOSCOPY Bilateral 11/09/2015   Procedure: VIDEO BRONCHOSCOPY WITHOUT FLUORO;  Surgeon: Denson Flake, MD;  Location: [facility];  Service: Cardiopulmonary;  Laterality: Bilateral;   Current Outpatient Medications on File Prior to Visit  Medication Sig Dispense Refill   albuterol  (VENTOLIN  HFA) 108 (90 Base) MCG/ACT inhaler Inhale 2 puffs into the lungs every 4 (four) hours as needed.     EPINEPHrine  (EPIPEN  2-PAK) 0.3 mg/0.3 mL IJ  SOAJ injection Inject 0.3 mg into the muscle as needed for anaphylaxis.     ibuprofen  (ADVIL ) 600 MG tablet Take 600 mg by mouth every 8 (eight) hours as needed for fever, headache, mild pain (pain score 1-3), moderate pain (pain score 4-6) or cramping.     ketoconazole (NIZORAL) 2 % shampoo Apply 1 Application topically 2 (two) times a week.     meloxicam  (MOBIC ) 15 MG tablet Take 1 tablet (15 mg total) by mouth daily. 30 tablet 3   Albuterol  Sulfate (PROAIR  RESPICLICK) 108 (90 Base) MCG/ACT AEPB Inhale 2 puffs into the lungs every 4 (four) hours as needed (Coughing/Wheezing). (Patient not taking: Reported on 07/29/2023)     No current facility-administered medications on file prior to visit.   No Known Allergies    PE Today's Vitals   12/30/23 0923  BP: 104/78  Pulse: 81  SpO2: 98%  Weight: 171 lb (77.6 kg)  Height: 5\' 8"  (1.727 m)   Body mass index is 26 kg/m.  Physical Exam Vitals reviewed. Exam conducted with a chaperone present.  Constitutional:      General: She is not in acute distress.    Appearance: Normal appearance.  HENT:     Head: Normocephalic and atraumatic.     Nose: Nose normal.  Eyes:     Extraocular Movements: Extraocular movements intact.     Conjunctiva/sclera: Conjunctivae normal.  Pulmonary:     Effort: Pulmonary effort is normal.  Genitourinary:    General: Normal vulva.     Exam position: Lithotomy position.     Uterus: Not enlarged.     Musculoskeletal:        General: Normal range of motion.     Cervical back: Normal range of motion.  Neurological:     General: No focal deficit present.     Mental Status: She is alert.  Psychiatric:        Mood and Affect: Mood normal.        Behavior: Behavior normal.      Assessment and Plan:        Perimenopause Assessment & Plan: +VMS and irregular periods Start 20mcg COC No contraindications, including hypertension, migraines with aura, smoking, and hx of VTE/DVT. Discussed side effects  including break through bleeding, mood changes, weight changes, headache, breast tenderness, nausea, and bloating. Warning signs including vision changes and leg swelling reviewed.  Irregular periods -     Drospirenone-Ethinyl Estradiol; Take 1 tablet by mouth daily.  Dispense: 84 tablet; Refill: 4 -     Pregnancy, urine  Skin tag of vulva Patient desires excision.  Reviewed possible risk of recurrence. Return office in 2 weeks for procedure.  Vulvar lesion Most consistent with folliculitis, however patient recently attempted I&D at home. Recommend warm compress, Neosporin, loosefitting underwear.  Follow-up in 2 weeks.   Romaine Closs, MD

## 2024-01-13 ENCOUNTER — Other Ambulatory Visit (HOSPITAL_COMMUNITY)
Admission: RE | Admit: 2024-01-13 | Discharge: 2024-01-13 | Disposition: A | Discharge status: Home / Self Care | Source: Ambulatory Visit | Attending: Obstetrics and Gynecology | Admitting: Obstetrics and Gynecology

## 2024-01-13 ENCOUNTER — Ambulatory Visit (INDEPENDENT_AMBULATORY_CARE_PROVIDER_SITE_OTHER): Admitting: Obstetrics and Gynecology

## 2024-01-13 ENCOUNTER — Encounter: Payer: Self-pay | Admitting: Obstetrics and Gynecology

## 2024-01-13 VITALS — BP 100/64 | HR 81 | Temp 98.1°F | Wt 169.0 lb

## 2024-01-13 DIAGNOSIS — N9089 Other specified noninflammatory disorders of vulva and perineum: Secondary | ICD-10-CM

## 2024-01-13 MED ORDER — LIDOCAINE-EPINEPHRINE 1 %-1:100000 IJ SOLN: 3.0000 mL | Freq: Once | INTRAMUSCULAR | Status: AC

## 2024-01-13 NOTE — Patient Instructions (Signed)
 Clean your incision daily with mild soapy water.  Sitz bath are helpful for wound healing.   Ensure that you thoroughly dry your wound following cleaning and keep dry throughout the day.  You can apply a thin layer of Aquaphor or Vaseline over your incision. Ice packs can be applied to your incision for up to 20 minutes at a time to help with pain and swelling. Take ibuprofen  as prescribed and over-the-counter Tylenol  as needed.

## 2024-01-13 NOTE — Progress Notes (Signed)
 48 y.o. G1P1001 perimenopausal female here for skin tag removal. Single. Works at [employer].   Patient's last menstrual period was 12/08/2023 (approximate).   Started on COC at last appt.  GYN HISTORY: No sig hx  OB History  Gravida Para Term Preterm AB Living  1 1 1   1   SAB IAB Ectopic Multiple Live Births      1    # Outcome Date GA Lbr Len/2nd Weight Sex Type Anes PTL Lv  1 Term      CS-Unspec   LIV   Past Medical History:  Diagnosis Date   Environmental allergies    Umbilical hernia    Past Surgical History:  Procedure Laterality Date   CESAREAN SECTION     UMBILICAL HERNIA REPAIR N/A 08/19/2017   Procedure: HERNIA REPAIR UMBILICAL ERAS PATHWAY;  Surgeon: Dareen Ebbing, MD;  Location: [facility];  Service: General;  Laterality: N/A;   VIDEO BRONCHOSCOPY Bilateral 11/09/2015   Procedure: VIDEO BRONCHOSCOPY WITHOUT FLUORO;  Surgeon: Denson Flake, MD;  Location: [facility] ENDOSCOPY;  Service: Cardiopulmonary;  Laterality: Bilateral;   Current Outpatient Medications on File Prior to Visit  Medication Sig Dispense Refill   albuterol  (VENTOLIN  HFA) 108 (90 Base) MCG/ACT inhaler Inhale 2 puffs into the lungs every 4 (four) hours as needed.     Albuterol  Sulfate (PROAIR  RESPICLICK) 108 (90 Base) MCG/ACT AEPB Inhale 2 puffs into the lungs every 4 (four) hours as needed (Coughing/Wheezing).     EPINEPHrine  (EPIPEN  2-PAK) 0.3 mg/0.3 mL IJ SOAJ injection Inject 0.3 mg into the muscle as needed for anaphylaxis.     ibuprofen  (ADVIL ) 600 MG tablet Take 600 mg by mouth every 8 (eight) hours as needed for fever, headache, mild pain (pain score 1-3), moderate pain (pain score 4-6) or cramping.     ketoconazole (NIZORAL) 2 % shampoo Apply 1 Application topically 2 (two) times a week.     meloxicam  (MOBIC ) 15 MG tablet Take 1 tablet (15 mg total) by mouth daily. 30 tablet 3   drospirenone -ethinyl estradiol  (YAZ) 3-0.02 MG tablet Take 1 tablet by mouth daily. (Patient not taking:  Reported on 01/13/2024) 84 tablet 4   No current facility-administered medications on file prior to visit.   No Known Allergies    PE Today's Vitals   01/13/24 1002  BP: 100/64  Pulse: 81  Temp: 98.1 F (36.7 C)  TempSrc: Oral  SpO2: 98%  Weight: 169 lb (76.7 kg)   Body mass index is 25.7 kg/m.  Physical Exam Vitals reviewed. Exam conducted with a chaperone present.  Constitutional:      General: She is not in acute distress.    Appearance: Normal appearance.  HENT:     Head: Normocephalic and atraumatic.     Nose: Nose normal.   Eyes:     Extraocular Movements: Extraocular movements intact.     Conjunctiva/sclera: Conjunctivae normal.   Pulmonary:     Effort: Pulmonary effort is normal.  Genitourinary:    General: Normal vulva.     Exam position: Lithotomy position.     Uterus: Not enlarged.     Musculoskeletal:        General: Normal range of motion.     Cervical back: Normal range of motion.   Neurological:     General: No focal deficit present.     Mental Status: She is alert.   Psychiatric:        Mood and Affect: Mood normal.  Behavior: Behavior normal.     Indication for biopsy: Elective removal of skin tag  The indications for vulvar biopsy were reviewed. Risks of the biopsy including pain, bleeding, infection, inadequate specimen, and need for additional procedures were discussed. The patient stated understanding and agreed to undergo procedure today. Consent was signed, and an adequate time out performed.   The patient's vulva was prepped with Betadine. 1% lidocaine  with epinephrine  was injected into the right lower vulva. Sterile scissors were used to excise the lesion at its base.  Small bleeding was noted. Interrupted suture was placed using 4-0 vicryl. Hemostasis was achieved.    The patient tolerated the procedure well.  Chaperone, S. Ura Hausen, CMA, was present for the entirety of the procedure  Specimens: skin tag  Post-procedure  instructions were given to the patient. The patient is to call with heavy bleeding, fever greater than 100.4, foul smelling vaginal discharge or other concerns. The patient will be return to clinic in two weeks for discussion of results.    Assessment and Plan:        Skin tag of vulva -     Lidocaine -EPINEPHrine  -     Surgical pathology  Uncx procedure RTO in 3 month for annual and med f/u.   Romaine Closs, MD

## 2024-01-15 LAB — SURGICAL PATHOLOGY

## 2024-01-16 ENCOUNTER — Ambulatory Visit: Payer: Self-pay | Admitting: Obstetrics and Gynecology

## 2024-01-16 DIAGNOSIS — N900 Mild vulvar dysplasia: Secondary | ICD-10-CM | POA: Insufficient documentation

## 2024-04-20 ENCOUNTER — Ambulatory Visit: Admitting: Podiatry

## 2024-05-03 ENCOUNTER — Ambulatory Visit: Admitting: Podiatry

## 2024-06-22 ENCOUNTER — Other Ambulatory Visit: Payer: Self-pay | Admitting: Student

## 2024-06-22 DIAGNOSIS — Z1231 Encounter for screening mammogram for malignant neoplasm of breast: Secondary | ICD-10-CM

## 2024-08-10 ENCOUNTER — Encounter: Payer: Self-pay | Admitting: Radiology

## 2024-08-10 ENCOUNTER — Ambulatory Visit
Admission: RE | Admit: 2024-08-10 | Discharge: 2024-08-10 | Disposition: A | Discharge status: Home / Self Care | Source: Ambulatory Visit | Attending: Student | Admitting: Student

## 2024-08-10 DIAGNOSIS — Z1231 Encounter for screening mammogram for malignant neoplasm of breast: Secondary | ICD-10-CM | POA: Insufficient documentation
